# Patient Record
Sex: Male | Born: 1960 | Race: White | Hispanic: Yes | Marital: Married | Smoking: Never smoker
Health system: Southern US, Community
[De-identification: ages and names within clinical notes are randomized; demographics above are authoritative.]

## PROBLEM LIST (undated history)

## (undated) DIAGNOSIS — R079 Chest pain, unspecified: Secondary | ICD-10-CM

## (undated) DIAGNOSIS — T7840XA Allergy, unspecified, initial encounter: Secondary | ICD-10-CM

## (undated) DIAGNOSIS — Z8711 Personal history of peptic ulcer disease: Secondary | ICD-10-CM

## (undated) DIAGNOSIS — I10 Essential (primary) hypertension: Secondary | ICD-10-CM

## (undated) DIAGNOSIS — N200 Calculus of kidney: Secondary | ICD-10-CM

## (undated) DIAGNOSIS — E7800 Pure hypercholesterolemia, unspecified: Secondary | ICD-10-CM

## (undated) DIAGNOSIS — K219 Gastro-esophageal reflux disease without esophagitis: Secondary | ICD-10-CM

## (undated) DIAGNOSIS — R51 Headache: Secondary | ICD-10-CM

## (undated) DIAGNOSIS — Z8719 Personal history of other diseases of the digestive system: Secondary | ICD-10-CM

## (undated) DIAGNOSIS — R519 Headache, unspecified: Secondary | ICD-10-CM

## (undated) DIAGNOSIS — R7303 Prediabetes: Secondary | ICD-10-CM

## (undated) HISTORY — DX: Gastro-esophageal reflux disease without esophagitis: K21.9

## (undated) HISTORY — PX: BACK SURGERY: SHX140

## (undated) HISTORY — PX: UVULOPALATOPHARYNGOPLASTY: SHX827

## (undated) HISTORY — DX: Allergy, unspecified, initial encounter: T78.40XA

---

## 1990-11-20 HISTORY — PX: SHOULDER ARTHROSCOPY W/ ROTATOR CUFF REPAIR: SHX2400

## 2003-11-21 HISTORY — PX: NOSE SURGERY: SHX723

## 2003-11-21 HISTORY — PX: LUMBAR DISC SURGERY: SHX700

## 2006-12-23 ENCOUNTER — Emergency Department (HOSPITAL_COMMUNITY): Admission: EM | Admit: 2006-12-23 | Discharge: 2006-12-23 | Payer: Self-pay | Admitting: Emergency Medicine

## 2010-03-20 HISTORY — PX: CARDIAC CATHETERIZATION: SHX172

## 2011-09-08 ENCOUNTER — Emergency Department (HOSPITAL_COMMUNITY)
Admission: EM | Admit: 2011-09-08 | Discharge: 2011-09-08 | Disposition: A | Discharge status: Home / Self Care | Payer: Managed Care, Other (non HMO) | Attending: Emergency Medicine | Admitting: Emergency Medicine

## 2011-09-08 ENCOUNTER — Emergency Department (HOSPITAL_COMMUNITY): Payer: Managed Care, Other (non HMO)

## 2011-09-08 DIAGNOSIS — R5381 Other malaise: Secondary | ICD-10-CM | POA: Insufficient documentation

## 2011-09-08 DIAGNOSIS — R5383 Other fatigue: Secondary | ICD-10-CM | POA: Insufficient documentation

## 2011-09-08 DIAGNOSIS — I498 Other specified cardiac arrhythmias: Secondary | ICD-10-CM | POA: Insufficient documentation

## 2011-09-08 DIAGNOSIS — R110 Nausea: Secondary | ICD-10-CM | POA: Insufficient documentation

## 2011-09-08 LAB — COMPREHENSIVE METABOLIC PANEL
ALT: 28 U/L (ref 0–53)
AST: 21 U/L (ref 0–37)
BUN: 15 mg/dL (ref 6–23)
CO2: 25 mEq/L (ref 19–32)
GFR calc non Af Amer: 84 mL/min — ABNORMAL LOW (ref >90)
Potassium: 4.1 mEq/L (ref 3.5–5.1)

## 2011-09-08 LAB — GLUCOSE, CAPILLARY: Glucose-Capillary: 107 mg/dL — ABNORMAL HIGH (ref 70–99)

## 2011-09-08 LAB — DIFFERENTIAL
Basophils Absolute: 0 10*3/uL (ref 0.0–0.1)
Eosinophils Absolute: 0.2 10*3/uL (ref 0.0–0.7)
Eosinophils Relative: 2 % (ref 0–5)
Lymphocytes Relative: 34 % (ref 12–46)
Lymphs Abs: 2.3 10*3/uL (ref 0.7–4.0)
Monocytes Absolute: 0.8 10*3/uL (ref 0.1–1.0)

## 2011-09-08 LAB — CBC
HCT: 42.2 % (ref 39.0–52.0)
Hemoglobin: 14.7 g/dL (ref 13.0–17.0)
MCHC: 34.8 g/dL (ref 30.0–36.0)
Platelets: 217 10*3/uL (ref 150–400)
RBC: 4.64 MIL/uL (ref 4.22–5.81)
RDW: 13.3 % (ref 11.5–15.5)

## 2011-12-17 ENCOUNTER — Encounter (HOSPITAL_COMMUNITY): Payer: Self-pay | Admitting: *Deleted

## 2011-12-17 ENCOUNTER — Other Ambulatory Visit: Payer: Self-pay

## 2011-12-17 ENCOUNTER — Emergency Department (HOSPITAL_COMMUNITY): Payer: Self-pay

## 2011-12-17 ENCOUNTER — Inpatient Hospital Stay (HOSPITAL_COMMUNITY)
Admission: EM | Admit: 2011-12-17 | Discharge: 2011-12-18 | DRG: 313 | Disposition: A | Discharge status: Home / Self Care | Payer: 59 | Attending: Cardiovascular Disease | Admitting: Cardiovascular Disease

## 2011-12-17 DIAGNOSIS — M25512 Pain in left shoulder: Secondary | ICD-10-CM

## 2011-12-17 DIAGNOSIS — E782 Mixed hyperlipidemia: Secondary | ICD-10-CM | POA: Diagnosis present

## 2011-12-17 DIAGNOSIS — M25519 Pain in unspecified shoulder: Secondary | ICD-10-CM | POA: Diagnosis present

## 2011-12-17 DIAGNOSIS — I10 Essential (primary) hypertension: Secondary | ICD-10-CM

## 2011-12-17 DIAGNOSIS — I200 Unstable angina: Secondary | ICD-10-CM

## 2011-12-17 DIAGNOSIS — M542 Cervicalgia: Secondary | ICD-10-CM | POA: Diagnosis present

## 2011-12-17 DIAGNOSIS — I2510 Atherosclerotic heart disease of native coronary artery without angina pectoris: Secondary | ICD-10-CM | POA: Diagnosis present

## 2011-12-17 DIAGNOSIS — R079 Chest pain, unspecified: Principal | ICD-10-CM | POA: Diagnosis present

## 2011-12-17 DIAGNOSIS — M940 Chondrocostal junction syndrome [Tietze]: Secondary | ICD-10-CM | POA: Diagnosis present

## 2011-12-17 HISTORY — DX: Pure hypercholesterolemia, unspecified: E78.00

## 2011-12-17 HISTORY — DX: Prediabetes: R73.03

## 2011-12-17 HISTORY — DX: Essential (primary) hypertension: I10

## 2011-12-17 LAB — CBC
MCH: 31.7 pg (ref 26.0–34.0)
MCHC: 35.3 g/dL (ref 30.0–36.0)
MCV: 89.8 fL (ref 78.0–100.0)
RBC: 4.7 MIL/uL (ref 4.22–5.81)
RDW: 13.6 % (ref 11.5–15.5)
WBC: 4.2 10*3/uL (ref 4.0–10.5)

## 2011-12-17 LAB — BASIC METABOLIC PANEL
CO2: 23 mEq/L (ref 19–32)
Creatinine, Ser: 0.92 mg/dL (ref 0.50–1.35)
GFR calc non Af Amer: >90 mL/min (ref >90)
Glucose, Bld: 107 mg/dL — ABNORMAL HIGH (ref 70–99)

## 2011-12-17 LAB — HEPATIC FUNCTION PANEL
ALT: 32 U/L (ref 0–53)
AST: 25 U/L (ref 0–37)
Albumin: 4.3 g/dL (ref 3.5–5.2)
Alkaline Phosphatase: 61 U/L (ref 39–117)
Bilirubin, Direct: <0.1 mg/dL (ref 0.0–0.3)
Total Bilirubin: 0.3 mg/dL (ref 0.3–1.2)

## 2011-12-17 LAB — PROTIME-INR: INR: 0.92 (ref 0.00–1.49)

## 2011-12-17 LAB — DIFFERENTIAL
Basophils Relative: 1 % (ref 0–1)
Eosinophils Absolute: 0.2 10*3/uL (ref 0.0–0.7)
Neutro Abs: 2.1 10*3/uL (ref 1.7–7.7)
Neutrophils Relative %: 50 % (ref 43–77)

## 2011-12-17 LAB — HEMOGLOBIN A1C: Mean Plasma Glucose: 131 mg/dL — ABNORMAL HIGH (ref <117)

## 2011-12-17 MED ORDER — ONDANSETRON HCL 4 MG/2ML IJ SOLN
4.0000 mg | Freq: Once | INTRAMUSCULAR | Status: AC
  Administered 2011-12-17: 4 mg via INTRAVENOUS

## 2011-12-17 MED ORDER — PANTOPRAZOLE SODIUM 40 MG PO TBEC
40.0000 mg | DELAYED_RELEASE_TABLET | Freq: Every day | ORAL | Status: DC
  Filled 2011-12-17: qty 1

## 2011-12-17 MED ORDER — MORPHINE SULFATE 4 MG/ML IJ SOLN
4.0000 mg | Freq: Once | INTRAMUSCULAR | Status: DC
  Filled 2011-12-17: qty 1

## 2011-12-17 MED ORDER — SODIUM CHLORIDE 0.9 % IV SOLN
Freq: Once | INTRAVENOUS | Status: AC
  Administered 2011-12-17: 20 mL/h via INTRAVENOUS

## 2011-12-17 MED ORDER — ASPIRIN 81 MG PO CHEW: 324.0000 mg | CHEWABLE_TABLET | Freq: Once | ORAL | Status: DC

## 2011-12-17 MED ORDER — MORPHINE SULFATE 4 MG/ML IJ SOLN
4.0000 mg | Freq: Once | INTRAMUSCULAR | Status: AC
  Administered 2011-12-17: 4 mg via INTRAVENOUS
  Filled 2011-12-17: qty 1

## 2011-12-17 MED ORDER — ASPIRIN 81 MG PO CHEW
324.0000 mg | CHEWABLE_TABLET | Freq: Once | ORAL | Status: AC
  Administered 2011-12-17: 324 mg via ORAL
  Filled 2011-12-17: qty 1

## 2011-12-17 MED ORDER — HEPARIN BOLUS VIA INFUSION
2200.0000 [IU] | Freq: Once | INTRAVENOUS | Status: AC
  Administered 2011-12-17: 2200 [IU] via INTRAVENOUS

## 2011-12-17 MED ORDER — SIMVASTATIN 20 MG PO TABS
20.0000 mg | ORAL_TABLET | Freq: Every day | ORAL | Status: DC
  Administered 2011-12-17: 20 mg via ORAL
  Filled 2011-12-17 (×3): qty 1

## 2011-12-17 MED ORDER — OXYCODONE-ACETAMINOPHEN 5-325 MG PO TABS
1.0000 | ORAL_TABLET | ORAL | Status: DC | PRN
  Administered 2011-12-18: 1 via ORAL
  Filled 2011-12-17: qty 1

## 2011-12-17 MED ORDER — ONDANSETRON HCL 4 MG/2ML IJ SOLN
INTRAMUSCULAR | Status: AC
  Administered 2011-12-17: 4 mg via INTRAVENOUS
  Filled 2011-12-17: qty 2

## 2011-12-17 MED ORDER — ACETAMINOPHEN 325 MG PO TABS: 650.0000 mg | ORAL_TABLET | Freq: Four times a day (QID) | ORAL | Status: DC | PRN

## 2011-12-17 MED ORDER — OLMESARTAN MEDOXOMIL 20 MG PO TABS
20.0000 mg | ORAL_TABLET | Freq: Every day | ORAL | Status: DC
  Administered 2011-12-17: 20 mg via ORAL
  Filled 2011-12-17 (×3): qty 1

## 2011-12-17 MED ORDER — KETOROLAC TROMETHAMINE 10 MG PO TABS
10.0000 mg | ORAL_TABLET | Freq: Four times a day (QID) | ORAL | Status: DC | PRN
  Filled 2011-12-17: qty 1

## 2011-12-17 MED ORDER — KETOROLAC TROMETHAMINE 30 MG/ML IJ SOLN
INTRAMUSCULAR | Status: AC
  Filled 2011-12-17: qty 1

## 2011-12-17 MED ORDER — HEPARIN SOD (PORCINE) IN D5W 100 UNIT/ML IV SOLN
1200.0000 [IU]/h | INTRAVENOUS | Status: DC
  Administered 2011-12-17: 1200 [IU]/h via INTRAVENOUS
  Filled 2011-12-17: qty 250

## 2011-12-17 MED ORDER — ONDANSETRON HCL 4 MG/2ML IJ SOLN
4.0000 mg | Freq: Once | INTRAMUSCULAR | Status: DC
  Filled 2011-12-17: qty 2

## 2011-12-17 MED ORDER — NITROGLYCERIN IN D5W 200-5 MCG/ML-% IV SOLN
2.0000 ug/min | Freq: Once | INTRAVENOUS | Status: AC
  Administered 2011-12-17: 5 ug/min via INTRAVENOUS
  Filled 2011-12-17: qty 250

## 2011-12-17 MED ORDER — KETOROLAC TROMETHAMINE 30 MG/ML IJ SOLN
30.0000 mg | INTRAMUSCULAR | Status: AC
  Administered 2011-12-17: 16:00:00 via INTRAVENOUS

## 2011-12-17 NOTE — ED Provider Notes (Signed)
History     CSN: [id number]  Arrival date & time 12/17/11  1130   First MD Initiated Contact with Patient 12/17/11 1157      Chief Complaint  Patient presents with  . Chest Pain    (Consider location/radiation/quality/duration/timing/severity/associated sxs/prior treatment) HPI  Patient presents to the emergency department complaining of intermittent left-sided chest pain over the last month with approximately 3-4 episodes of chest pain that he describes as acute in onset of chest pain with radiation of pain into left neck and down left arm that he describes as crushing with associated nausea, diaphoresis, and shortness of breath. Patient states the symptoms will last anywhere between 20-30 minutes but then resolve after he sits down and takes an aspirin. Patient states that the last episode was this morning at approximately 6 AM and then resolved after taking ASA and after about 20 minutes. Patient denies any current symptoms. Patient has history of high blood pressure, high cholesterol, and borderline diabetes. Patient has recently moved from [place] and has not established care with a primary care physician in [place] up to this point. Patient has significant family history early heart disease with his mother dying at age 19 of a heart attack, his father having heart disease at age 3, and a sister having "heart problems at 51." Patient states that in May of 2011 he had a cardiac catheterization in a hospital in [place] and was told "no blockages but grease in my arteries." Patient states he did not have to follow up with cardiologist after the catheterization. Patient takes a daily aspirin.  Past Medical History  Diagnosis Date  . Hypertension   . High cholesterol   . Borderline diabetes     Past Surgical History  Procedure Date  . Cardiac catheterization     May 2011  . Back surgery   . Hernia repair   . Left shoulder surgery     History reviewed. No pertinent family  history.  History  Substance Use Topics  . Smoking status: Never Smoker   . Smokeless tobacco: Never Used  . Alcohol Use: No      Review of Systems  All other systems reviewed and are negative.    Allergies  Contrast media; Food; and Shrimp  Home Medications   Current Outpatient Rx  Name Route Sig Dispense Refill  . ASPIRIN EC 81 MG PO TBEC Oral Take 81 mg by mouth daily.    . ATORVASTATIN CALCIUM 10 MG PO TABS Oral Take 10 mg by mouth daily.    Marland Kitchen VITAMIN D 2000 UNITS PO TABS Oral Take 2,000 Units by mouth daily.    Marland Kitchen NAPROXEN 250 MG PO TABS Oral Take 250 mg by mouth 2 (two) times daily as needed. For pain.    Marland Kitchen OLMESARTAN MEDOXOMIL 20 MG PO TABS Oral Take 20 mg by mouth daily.    Marland Kitchen OMEPRAZOLE 40 MG PO CPDR Oral Take 40 mg by mouth daily.      BP 136/74  Pulse 65  Temp(Src) 98.4 F (36.9 C) (Oral)  Resp 18  Ht 5\' 5"  (1.651 m)  Wt 180 lb (81.647 kg)  BMI 29.95 kg/m2  SpO2 100%  Physical Exam  Nursing note and vitals reviewed. Constitutional: He is oriented to person, place, and time. He appears well-developed and well-nourished. No distress.  HENT:  Head: Normocephalic and atraumatic.  Eyes: Conjunctivae are normal.  Neck: Normal range of motion. Neck supple.  Cardiovascular: Normal rate, regular rhythm, normal heart  sounds and intact distal pulses.  Exam reveals no gallop and no friction rub.   No murmur heard. Pulmonary/Chest: Effort normal and breath sounds normal. No respiratory distress. He has no wheezes. He has no rales. He exhibits no tenderness.  Abdominal: Soft. Bowel sounds are normal. He exhibits no distension and no mass. There is no tenderness. There is no rebound and no guarding.  Musculoskeletal: Normal range of motion. He exhibits no edema and no tenderness.  Neurological: He is alert and oriented to person, place, and time.  Skin: Skin is warm and dry. No rash noted. He is not diaphoretic. No erythema.  Psychiatric: He has a normal mood and  affect.    ED Course  Procedures (including critical care time)  PO aspirin, nitroglycerin drip. IV morphine and zofran   Date: 12/17/2011  Rate: 72  Rhythm: normal sinus rhythm  QRS Axis: normal  Intervals: normal  ST/T Wave abnormalities: normal  Conduction Disutrbances:none  Narrative Interpretation:   Old EKG Reviewed: non provactive EKG with no significant changes from Sep 08, 2011    Labs Reviewed  DIFFERENTIAL - Abnormal; Notable for the following:    Eosinophils Relative 6 (*)    All other components within normal limits  BASIC METABOLIC PANEL - Abnormal; Notable for the following:    Sodium 134 (*)    Glucose, Bld 107 (*)    All other components within normal limits  CBC  TROPONIN I  PROTIME-INR  HEPATIC FUNCTION PANEL  HEMOGLOBIN A1C   Dg Chest 2 View  12/17/2011  *RADIOLOGY REPORT*  Clinical Data: Chest pain for 1 month.  SOB for 2 days.  History of hypertension.  CHEST - 2 VIEW  Comparison: 09/08/2011  Findings: The heart size and mediastinal contours are within normal limits.  Both lungs are clear.  The visualized skeletal structures are unremarkable.  IMPRESSION: Negative exam.  Original Report Authenticated By: Rosealee Albee, M.D.     1. Unstable angina   2. Chest pain       MDM  VSS. Dr. Roseanne Reno to admit for work up.       [place], [place] 12/17/11 1950

## 2011-12-17 NOTE — ED Notes (Signed)
Pt from home c/o left chest pain radiating down left arm into back and up into neck. Pt also endorses sweating, pale, dizziness, nausea, headache, shortness of breath and generalized weakness that started last night.

## 2011-12-17 NOTE — H&P (Addendum)
Physician History and Physical  Patient ID: Alejandro Turner MRN: [id number] DOB/AGE: 52-May-1962 52 y.o. Admit date: 12/17/2011  Primary Care Physician: No primary provider on file. Primary Cardiologist: New  Active Problems:  Chest pain  HTN (hypertension)  Mixed hyperlipidemia  Left shoulder pain   HPI:  51 yo family history of CAD, HTN, and elevated lipids.  To [facility] ER with [facility].  Pain somewhat atypical.  More left shoulder and neck.  Associated with numbness when sleeping.  Previous traumatic injury and  Left shoulder surgery.  Pain with neck and arm motion as well.  Also complains of chronic dizzyness.  Had cath 2 years ago in [place] with "grease" but no bad blockages and no need for PCI/Stent.  Had f/U stress test Last year also normal.  Pain is similar.  No associated dyspnea, palpitations, syncope or edema.  Pain 3/10 on iv nitro despite normal enzymes and normal ECG.  Compliant with meds.  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    Review of systems complete and found to be negative unless listed above   Past Medical History  Diagnosis Date  . Hypertension   . High cholesterol   . Borderline diabetes     History reviewed. No pertinent family history.  History   Social History  . Marital Status: Married    Spouse Name: N/A    Number of Children: N/A  . Years of Education: N/A   Occupational History  . 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 Smoking status: Never Smoker   . Smokeless tobacco: Never Used  . Alcohol Use: No  . Drug Use: No  . Sexually Active:    Other Topics Concern  . Not on file   Social History Narrative   Non smokerNon drinkerSedentaryMoved from [place] 52 months ago    Past Surgical History  Procedure Date  . Cardiac catheterization     May 2011  . Back surgery   . Hernia repair   . Left shoulder surgery       Physical Exam: Blood pressure 115/77, pulse 75,  temperature 98.4 F (36.9 C), temperature source Oral, resp. rate 19, height 5\' 5"  (1.651 m), weight 81.647 kg (180 lb), SpO2 100.00%.  Affect appropriate Healthy:  appears stated age HEENT: normal Neck supple with no adenopathy JVP normal no bruits no thyromegaly Lungs clear with no wheezing and good diaphragmatic motion Heart:  S1/S2 no murmur, no rub, gallop or click PMI normal Abdomen: benighn, BS positve, no tenderness, no AAA no bruit.  No HSM or HJR Distal pulses intact with no bruits No edema Neuro non-focal Skin warm and dry No muscular weakness Scar left shoulder from surgery   Labs:   Lab Results  Component Value Date   WBC 4.2 12/17/2011   HGB 14.9 12/17/2011   HCT 42.2 12/17/2011   MCV 89.8 12/17/2011   PLT 232 12/17/2011     Lab 12/17/11 1230  NA 134*  K 4.1  CL 100  CO2 23  BUN 16  CREATININE 0.92  CALCIUM 10.0  PROT 7.8  BILITOT 0.3  ALKPHOS 61  ALT 32  AST 25  GLUCOSE 107*   Allergies  Allergen Reactions  . Contrast Media (Iodinated Diagnostic Agents) Rash  . Food Rash    Soy beans  . Shrimp (Shellfish Allergy) Hives      Radiology: Dg  Chest 2 View  12/17/2011  *RADIOLOGY REPORT*  Clinical Data: Chest pain for 1 month.  SOB for 2 days.  History of hypertension.  CHEST - 2 VIEW  Comparison: 09/08/2011  Findings: The heart size and mediastinal contours are within normal limits.  Both lungs are clear.  The visualized skeletal structures are unremarkable.  IMPRESSION: Negative exam.  Original Report Authenticated By: Rosealee Albee, M.D.    EKG: NSR normal ECG  ASSESSMENT AND PLAN:  Chest Pain:  Atypical featues  Multiple CRF;s including HTN, chol and family history.  Historically negative w.u last two years  Admit  R/O He is allergic to contrast.  Wean nitro.  Toradol for pain.  Stress echo in am HTN:  Well controlled continue home meds Chol:  Continue statin LFT;s normal OSA:  Previous nasal an pallate surgery with chronic dizzyness     Needs primary care MD  Cardiologist in [facility] [place] [phone]  Signed: Theron Arista Nishan52/27/2013, 4:06 PM

## 2011-12-17 NOTE — ED Provider Notes (Signed)
Medical screening examination/treatment/procedure(s) were conducted as a shared visit with non-physician practitioner(s) and myself.  I personally evaluated the patient during the encounter  Concerning story for unstable angina with multiple cardiac risk factors. ASA, heparin, nitro. ecg and troponin normal. Spoke with Dr Eden Emms, cardiology who will evaluate in ER and admit  1. Unstable angina   2. Chest pain    Results for orders placed during the hospital encounter of 12/17/11  CBC      Component Value Range   WBC 4.2  4.0 - 10.5 (K/uL)   RBC 4.70  4.22 - 5.81 (MIL/uL)   Hemoglobin 14.9  13.0 - 17.0 (g/dL)   HCT 40.9  81.1 - 91.4 (%)   MCV 89.8  78.0 - 100.0 (fL)   MCH 31.7  26.0 - 34.0 (pg)   MCHC 35.3  30.0 - 36.0 (g/dL)   RDW 78.2  95.6 - 21.3 (%)   Platelets 232  150 - 400 (K/uL)  DIFFERENTIAL      Component Value Range   Neutrophils Relative 50  43 - 77 (%)   Neutro Abs 2.1  1.7 - 7.7 (K/uL)   Lymphocytes Relative 33  12 - 46 (%)   Lymphs Abs 1.4  0.7 - 4.0 (K/uL)   Monocytes Relative 10  3 - 12 (%)   Monocytes Absolute 0.4  0.1 - 1.0 (K/uL)   Eosinophils Relative 6 (*) 0 - 5 (%)   Eosinophils Absolute 0.2  0.0 - 0.7 (K/uL)   Basophils Relative 1  0 - 1 (%)   Basophils Absolute 0.0  0.0 - 0.1 (K/uL)  BASIC METABOLIC PANEL      Component Value Range   Sodium 134 (*) 135 - 145 (mEq/L)   Potassium 4.1  3.5 - 5.1 (mEq/L)   Chloride 100  96 - 112 (mEq/L)   CO2 23  19 - 32 (mEq/L)   Glucose, Bld 107 (*) 70 - 99 (mg/dL)   BUN 16  6 - 23 (mg/dL)   Creatinine, Ser 0.86  0.50 - 1.35 (mg/dL)   Calcium 57.8  8.4 - 10.5 (mg/dL)   GFR calc non Af Amer >90  >90 (mL/min)   GFR calc Af Amer >90  >90 (mL/min)  TROPONIN I      Component Value Range   Troponin I <0.30  <0.30 (ng/mL)  PROTIME-INR      Component Value Range   Prothrombin Time 12.6  11.6 - 15.2 (seconds)   INR 0.92  0.00 - 1.49   HEPATIC FUNCTION PANEL      Component Value Range   Total Protein 7.8  6.0 - 8.3  (g/dL)   Albumin 4.3  3.5 - 5.2 (g/dL)   AST 25  0 - 37 (U/L)   ALT 32  0 - 53 (U/L)   Alkaline Phosphatase 61  39 - 117 (U/L)   Total Bilirubin 0.3  0.3 - 1.2 (mg/dL)   Bilirubin, Direct <4.6  0.0 - 0.3 (mg/dL)   Indirect Bilirubin NOT CALCULATED  0.3 - 0.9 (mg/dL)  HEMOGLOBIN N6E      Component Value Range   Hemoglobin A1C 6.2 (*) <5.7 (%)   Mean Plasma Glucose 131 (*) <117 (mg/dL)     Lyanne Co, MD 12/17/11 2045

## 2011-12-17 NOTE — Progress Notes (Signed)
ANTICOAGULATION CONSULT NOTE - Initial Consult  Pharmacy Consult for Heparin Indication: ACS/STEMI  Allergies  Allergen Reactions  . Contrast Media (Iodinated Diagnostic Agents) Rash  . Food Rash    Soy beans  . Shrimp (Shellfish Allergy) Hives    Patient Measurements: Height: 5\' 5"  (165.1 cm) Weight: 180 lb (81.647 kg) IBW/kg (Calculated) : 61.5  Heparin Dosing Weight: 69kg  Vital Signs: Temp: 98.4 F (36.9 C) (01/27 1143) Temp src: Oral (01/27 1143) BP: 118/73 mmHg (01/27 1333) Pulse Rate: 68  (01/27 1333)  Labs:  Basename 12/17/11 1230  HGB 14.9  HCT 42.2  PLT 232  APTT --  LABPROT 12.6  INR 0.92  HEPARINUNFRC --  CREATININE 0.92  CKTOTAL --  CKMB --  TROPONINI <0.30   Estimated Creatinine Clearance: 93.4 ml/min (by C-G formula based on Cr of 0.92).  Medical History: Past Medical History  Diagnosis Date  . Hypertension   . High cholesterol   . Borderline diabetes     Medications:  Scheduled:    . sodium chloride   Intravenous Once  . aspirin  324 mg Oral Once  . morphine  4 mg Intravenous Once  . nitroGLYCERIN  2-200 mcg/min Intravenous Once  . ondansetron        Assessment: 51 yo male presents with chest pain radiating down left arm into back and neck; troponin is negative.  Beginning IV heparin for full dose anticoagulation.  Goal of Therapy:  Heparin level 0.3-0.7 units/ml   Plan:   Heparin 2200 units IV bolus x1, then  Heparin 1200 units/hr IV infusion  Check heparin level in 6hrs  Check daily heparin level and CBC  Loralee Pacas, PharmD, BCPS Pager: [phone] 12/17/2011,1:39 PM

## 2011-12-18 ENCOUNTER — Other Ambulatory Visit: Payer: Self-pay

## 2011-12-18 DIAGNOSIS — R079 Chest pain, unspecified: Secondary | ICD-10-CM

## 2011-12-18 DIAGNOSIS — R072 Precordial pain: Secondary | ICD-10-CM

## 2011-12-18 LAB — CBC
HCT: 40.9 % (ref 39.0–52.0)
Hemoglobin: 13.7 g/dL (ref 13.0–17.0)
MCH: 30.8 pg (ref 26.0–34.0)
MCHC: 33.5 g/dL (ref 30.0–36.0)
MCV: 91.9 fL (ref 78.0–100.0)
RDW: 13.9 % (ref 11.5–15.5)

## 2011-12-18 MED ORDER — KETOROLAC TROMETHAMINE 10 MG PO TABS
10.0000 mg | ORAL_TABLET | Freq: Four times a day (QID) | ORAL | Status: DC
  Filled 2011-12-18 (×4): qty 1

## 2011-12-18 MED ORDER — OXYCODONE-ACETAMINOPHEN 5-325 MG PO TABS: 1.0000 | ORAL_TABLET | ORAL | Status: AC | PRN

## 2011-12-18 MED ORDER — NITROGLYCERIN 0.4 MG SL SUBL
SUBLINGUAL_TABLET | SUBLINGUAL | Status: AC
  Administered 2011-12-18: 09:00:00
  Filled 2011-12-18: qty 25

## 2011-12-18 MED ORDER — IBUPROFEN 800 MG PO TABS: 800.0000 mg | ORAL_TABLET | Freq: Three times a day (TID) | ORAL | Status: AC

## 2011-12-18 MED ORDER — MORPHINE SULFATE 2 MG/ML IJ SOLN: 2.0000 mg | INTRAMUSCULAR | Status: DC | PRN

## 2011-12-18 MED ORDER — NAPROXEN 250 MG PO TABS: 250.0000 mg | ORAL_TABLET | Freq: Two times a day (BID) | ORAL | Status: DC | PRN

## 2011-12-18 NOTE — Progress Notes (Signed)
GXT Echo 12/18/2011 10:47 AM  Pre test: No CP/SOB. With GXT, pt had SOB with exertion but no CP. Target HR 143 reached and exceeded in stage 3. Used manual increases to get HR > 160. Pt tolerated well. ECG with upsloping ST depression but no down-sloping segments and no ST elevation. BP appropriately increased with exercise.   Images and final report pending.

## 2011-12-18 NOTE — Progress Notes (Signed)
Received call from ECHO lab stating that patient needs help finding PCP. Noted in system that patient has [employer] and that he needs to call the number on the back of his card and the insurance company will help him find a PCP that is in his network in order to receive the most benefits. This CM does not have access to [employer] in network providers.  Genella Rife [facility] 12/18/2011 1137am  Just received call back stating that patient states that he really does not have any insurance. Will provide him with numbers for [facility], [facility] for self pay clinics. Advised that he will have to meet eligibility first and then they will set up appointment and could be some time out. Genella Rife [facility] 12/18/2011 1140am

## 2011-12-18 NOTE — Progress Notes (Signed)
2 saline locks discontinued.  One from right hand , the other left hand.  No difficulty, both angiocaths intact.      Haskel Khan RN

## 2011-12-18 NOTE — Progress Notes (Signed)
RN called because pt c/o 6/10 cp earlier, now worse. [facility] ready to bring over for MV. Pt rec'd SL NTG x1 without sig change in pain. Reviewed data. Pt with CP continuous > 12 hours with negative enzymes and no ST elevation. Per [facility] note, pain worse with movement and Sx consistent with MS pain. Pt has refused Toradol but accepts MSO4 and Percocet for pain.   Advised RN, OK to bring over for MV and re-ordered MSO4 PRN x 3 doses. Will encourage pt to accept Toradol and schedule it q  6 hours x 24 hours.

## 2011-12-18 NOTE — Discharge Summary (Signed)
CARDIOLOGY DISCHARGE SUMMARY   Patient ID: Ramondo Dietze MRN: [id number] DOB/AGE: 1961-01-30 51 y.o.  Admit date: 12/17/2011 Discharge date: 12/18/2011  Primary Discharge Diagnosis:  Chest pain Secondary Discharge Diagnosis:  Patient Active Problem List  Diagnoses  . Chest pain  . HTN (hypertension)  . Mixed hyperlipidemia  . Left shoulder pain   Procedures: 2-view CXR, GXT Echo  Hospital Course: Mr Prestia is a 51 year-old male with a history of chest pain and ?minimal/non-obstructive disease by cath in [place] a few years ago. He had prolonged chest pain and developed SOB as well. He came to the hospital where he was admitted for further evaluation and treatment.  His cardiac enzymes were negative for MI. He had a slight elevation in the CK but no elevation in the MB or Troponins. His HgA1C was mildly elevated and he is encouraged to limit sugars and carbohydrates in his diet. He was continued on his home medications for cholesterol and hypertension. His chest pain did not change with nitroglycerin but responded to pain control meds.   On 12-18-2011, Mr Rennaker had a stress-echocardiogram. He had good exercise tolerance and target heart rate (143) was reached in stage 3. He continued with exercise and was able to get his heart rate up to >160. No chest pain with exertion. He was appropriately SOB with exertion and his BP increased with exercise as well. The images were reviewed by Dr Myrtis Ser. His EF was normal and there were no wall motion abnormalities with exertion. Mr Steyer is considered stable for discharge, to follow up with cardiology as needed. He is encouraged to obtain a primary care physician.  Labs:   Lab Results  Component Value Date   WBC 5.9 12/18/2011   HGB 13.7 12/18/2011   HCT 40.9 12/18/2011   MCV 91.9 12/18/2011   PLT 224 12/18/2011    Lab 12/17/11 1230  NA 134*  K 4.1  CL 100  CO2 23  BUN 16  CREATININE 0.92  CALCIUM 10.0  PROT 7.8  BILITOT 0.3  ALKPHOS 61   ALT 32  AST 25  GLUCOSE 107*    Lab Results  Component Value Date   HGBA1C 6.2* 12/17/2011     Basename 12/17/11 2010 12/17/11 1230  CKTOTAL 258* --  CKMB 2.6 --  CKMBINDEX -- --  TROPONINI <0.30 <0.30   Lipid Panel  No results found for this basename: chol, trig, hdl, cholhdl, vldl, ldlcalc    No results found for this basename: probnp    Basename 12/17/11 1230  INR 0.92       Radiology:  CHEST - 2 VIEW 12/17/2011 Comparison: 09/08/2011 Findings: The heart size and mediastinal contours are within normal limits. Both lungs are clear. The visualized skeletal structures are unremarkable. IMPRESSION: Negative exam.  EKG:18-Dec-2011 09:07:06 Normal sinus rhythm Normal ECG Vent. rate 69 BPM PR interval 146 ms QRS duration 104 ms QT/QTc 370/396 ms P-R-T axes 57 47 30  Echo: 12/18/2011 Study Conclusions - Stress ECG conclusions: The stress ECG was normal. - Impressions: At rest there is normal LV function with no wall abnormalities. The EF is 60%. With stress there is increase in thickening and contractility in all segments. Wall motion is vigorous. Cavity size decreases. This is a normal stress echo study. Impressions: - At rest there is normal LV function with no wall abnormalities. The EF is 60%. With stress there is increase in thickening and contractility in all segments. Wall motion is vigorous. Cavity size decreases. This is  a normal stress echo study   FOLLOW UP PLANS AND APPOINTMENTS Discharge Orders    Future Appointments: Provider: Department: Dept Phone: Center:   12/29/2011 10:00 AM Wanda Plump, MD [facility] [phone] [facility]     Medication List  As of 12/18/2011 11:11 AM   TAKE these medications         aspirin EC 81 MG tablet   Take 81 mg by mouth daily.      atorvastatin 10 MG tablet   Commonly known as: LIPITOR   Take 10 mg by mouth daily.         olmesartan 20 MG tablet   Commonly known as: BENICAR   Take 20 mg by mouth  daily.      omeprazole 40 MG capsule   Commonly known as: PRILOSEC   Take 40 mg by mouth daily.      Vitamin D 2000 UNITS tablet   Take 2,000 Units by mouth daily.        STOP TAKING   naproxen 250 MG tablet while on Ibuprofen/Motrin   Commonly known as: NAPROSYN   Take 250 mg by mouth 2 (two) times daily as needed. For pain.    NEW MEDS: Ibuprofen 800 mg - 1 tab 3 x daily for 1 week, then OK to resume Naprosyn  Percocet 5/325 - 1 tab TID PRN (#20, no refills)    Follow-up Information    Follow up with Charlton Haws, MD in 3 months. (As needed)    Contact information:   [address], [address] [place] [address] [phone]          BRING ALL MEDICATIONS WITH YOU TO FOLLOW UP APPOINTMENTS  Time spent with patient to include physician time: 33 min Signed: Theodore Demark 12/18/2011, 11:11 AM Co-Sign MD

## 2011-12-18 NOTE — Progress Notes (Signed)
Stress Echo completed.  Victorya Hillman Johnson, RDCS 

## 2011-12-18 NOTE — Progress Notes (Signed)
Subjective:  No dyspnea. Complains of pain left upper chest and shoulder, constant. Pain is worse with arm movement and positional changes.  Objective:  Vital Signs in the last 24 hours: Temp:  [98 F (36.7 C)-98.4 F (36.9 C)] 98.2 F (36.8 C) (01/28 0557) Pulse Rate:  [64-75] 68  (01/28 0557) Resp:  [11-20] 17  (01/28 0557) BP: (104-147)/(62-83) 111/72 mmHg (01/28 0557) SpO2:  [97 %-100 %] 98 % (01/28 0557) Weight:  [81.647 kg (180 lb)] 81.647 kg (180 lb) (01/27 1917)  Intake/Output from previous day:    Physical Exam: Pt is alert and oriented, NAD HEENT: normal Neck: JVP - normal, carotids 2+= without bruits Lungs: CTA bilaterally Chest: pain in left upper chest with palpation CV: RRR without murmur or gallop Abd: soft, NT, Positive BS, no hepatomegaly Ext: no C/C/E, distal pulses intact and equal Skin: warm/dry no rash   Lab Results:  Basename 12/18/11 0425 12/17/11 1230  WBC 5.9 4.2  HGB 13.7 14.9  PLT 224 232    Basename 12/17/11 1230  NA 134*  K 4.1  CL 100  CO2 23  GLUCOSE 107*  BUN 16  CREATININE 0.92    Basename 12/17/11 2010 12/17/11 1230  TROPONINI <0.30 <0.30   Tele: sinus rhythm, no arrhythmia noted.  Assessment/Plan:  1. Atypical chest pain - reproducible on exam 2. HTN 3. Hyperlipidemia  Pt with CVRF, but pain atypical and I suspect costochondritis. Cardiac markers are negative. Plan for stress echo today, discharge home on 1-2 week course of NSAID if negative. Pt needs PCP - he lives in [place]. Further disposition pending stress test result.  Tonny Bollman, M.D. 12/18/2011, 6:49 AM

## 2011-12-19 ENCOUNTER — Encounter: Payer: Self-pay | Admitting: Internal Medicine

## 2011-12-19 ENCOUNTER — Ambulatory Visit (INDEPENDENT_AMBULATORY_CARE_PROVIDER_SITE_OTHER): Payer: Self-pay | Admitting: Internal Medicine

## 2011-12-19 VITALS — BP 122/76 | HR 65 | Temp 98.1°F | Ht 65.0 in | Wt 187.0 lb

## 2011-12-19 DIAGNOSIS — R7303 Prediabetes: Secondary | ICD-10-CM

## 2011-12-19 DIAGNOSIS — R5381 Other malaise: Secondary | ICD-10-CM

## 2011-12-19 DIAGNOSIS — R5383 Other fatigue: Secondary | ICD-10-CM

## 2011-12-19 DIAGNOSIS — R079 Chest pain, unspecified: Secondary | ICD-10-CM

## 2011-12-19 DIAGNOSIS — I10 Essential (primary) hypertension: Secondary | ICD-10-CM

## 2011-12-19 DIAGNOSIS — E782 Mixed hyperlipidemia: Secondary | ICD-10-CM

## 2011-12-19 DIAGNOSIS — R7309 Other abnormal glucose: Secondary | ICD-10-CM

## 2011-12-19 NOTE — Assessment & Plan Note (Signed)
Last A1c 6.2, continue with diet

## 2011-12-19 NOTE — Patient Instructions (Signed)
Will arrange for a endocrinology referral Will arrange for a holter monitor

## 2011-12-19 NOTE — Assessment & Plan Note (Signed)
Continue with core 

## 2011-12-19 NOTE — Progress Notes (Signed)
  Subjective:    Patient ID: Alejandro Turner, male    DOB: 12/09/1960, 51 y.o.   MRN: [id number]  HPI New patient, here w/ his wife (who is also my patient) He reports the following symptoms: Chest pain for at least 5 years, on and off, a different places in the chest. He presented to the hospital a few days ago, chart reviewed: Was eval by  cardiology, stress echo reportedly negative BMP and LFTs normal.A1c 6.2. CBC normal. Chest x-ray negative  Also 2 years history of intense headaches on -off, previous PCP did "CTs and MRIs" no clear-cut diagnosis was found. Also 6 months history of on and off episodes  nausea generalized weakness particularly in the legs, loss of equilibrium. Wife has witnessed these episodes and he becomes pale,  one time he responded well to glucose by mouth. His previous doctors have seen him with these symptoms several times, apparently saw a ?ENT and had a "balance test" which was normal.  Past medical history Hypertension Borderline diabetes Hyperlipidemia Cardiac catheterization on 03/2010, [place]---> normal GERD   Past surgical history Uvulectomy for snoring Back surgery 1999 Left shoulder surgery 2000 Nose surgery 2005   Social history Re-married, 4 children. Moved from [place] ~04-2011, wife lives in [place] From [place] Job--former school Runner, broadcasting/film/video, unemployed, in litigation with the school system in [place] Tobacco --no  Alcohol-- no    Family history Diabetes-- F, M, sister CAD-- F, M (MI age 1) , sister  Stroke--no Colon cancer--no Breast cancer-- GM, aunt, M, sister Prostate cancer--no   Review of Systems Denies any syncope per se. No seizures. GERD symptoms well controlled. Occasional palpitations with episodes as described above. Denies anxiety or depression per se. He has a history of severe snoring, currently is snoring only mildly, he wakes up rested.    Objective:   Physical Exam  Constitutional: He is oriented to person, place, and time. He  appears well-developed and well-nourished.  HENT:  Head: Normocephalic and atraumatic.  Cardiovascular: Normal rate, regular rhythm and normal heart sounds.   No murmur heard. Pulmonary/Chest: Effort normal and breath sounds normal. No respiratory distress. He has no wheezes. He has no rales.  Abdominal: Soft. He exhibits no distension. There is no tenderness. There is no rebound.  Musculoskeletal:       Speech, gait and motor are intact. CN normal  Neurological: He is alert and oriented to person, place, and time.  Psychiatric: He has a normal mood and affect. His behavior is normal. Judgment and thought content normal.       Assessment & Plan:  Today , I spent more than 35  min with the patient, >50% of the time counseling,learning  his complicated PMH, reviewing the chart and labs ordered by other providers .

## 2011-12-19 NOTE — Assessment & Plan Note (Signed)
Chronic symptoms Negative cardiac catheterization 2011 in [place]. Patient reports almost"one stress test every year for the last 5 years" Stress test yesterday reportedly normal.  Plan no further workup

## 2011-12-19 NOTE — Assessment & Plan Note (Signed)
Continue with Benicar 

## 2011-12-20 ENCOUNTER — Encounter: Payer: Self-pay | Admitting: Internal Medicine

## 2011-12-20 DIAGNOSIS — R5383 Other fatigue: Secondary | ICD-10-CM | POA: Insufficient documentation

## 2011-12-20 NOTE — Discharge Summary (Signed)
Agree as outlined. See my progress note the same date 

## 2011-12-20 NOTE — Assessment & Plan Note (Signed)
Episodes of episodic fatigue-weakness, HA, nausea, imbalance: Sx going on for 6 months, clinically not consistent with syncope or a seizure.  Differential diagnosis is large, plan: 1. holter 2. Refer to endocrinology : pheochromocytoma? , hypoglycemia? Will also consider referral to ENT, vertigo ?

## 2011-12-21 ENCOUNTER — Ambulatory Visit: Payer: Managed Care, Other (non HMO) | Admitting: Internal Medicine

## 2011-12-29 ENCOUNTER — Telehealth: Payer: Self-pay

## 2011-12-29 ENCOUNTER — Ambulatory Visit: Payer: Managed Care, Other (non HMO) | Admitting: Internal Medicine

## 2012-01-02 ENCOUNTER — Ambulatory Visit (INDEPENDENT_AMBULATORY_CARE_PROVIDER_SITE_OTHER): Payer: Self-pay | Admitting: Endocrinology

## 2012-01-02 ENCOUNTER — Other Ambulatory Visit (INDEPENDENT_AMBULATORY_CARE_PROVIDER_SITE_OTHER): Payer: Self-pay

## 2012-01-02 ENCOUNTER — Other Ambulatory Visit: Payer: Self-pay

## 2012-01-02 ENCOUNTER — Encounter: Payer: Self-pay | Admitting: Endocrinology

## 2012-01-02 VITALS — BP 104/62 | HR 66 | Temp 97.1°F | Ht 65.0 in | Wt 184.0 lb

## 2012-01-02 DIAGNOSIS — E871 Hypo-osmolality and hyponatremia: Secondary | ICD-10-CM

## 2012-01-02 DIAGNOSIS — I10 Essential (primary) hypertension: Secondary | ICD-10-CM

## 2012-01-02 MED ORDER — COSYNTROPIN 0.25 MG IJ SOLR
0.2500 mg | Freq: Once | INTRAMUSCULAR | Status: AC
  Administered 2012-01-02: 0.25 mg via INTRAMUSCULAR

## 2012-01-02 NOTE — Telephone Encounter (Signed)
Patient call back to make appt for 01/03/12 to have monitor put on.

## 2012-01-02 NOTE — Patient Instructions (Addendum)
blood tests (and a 24-HR urine test) are requested for you today.  please call [phone] to hear your test results.  You will be prompted to enter the 9-digit "MRN" number that appears at the top left of this page, followed by #.  Then you will hear the message. Reduce benicar to 1/2 of a 20 mg pill, daily.  (update: i left message on phone-tree:  rx as we discussed)

## 2012-01-02 NOTE — Progress Notes (Signed)
Subjective:    Patient ID: Alejandro Turner, male    DOB: Oct 25, 1961, 51 y.o.   MRN: [id number]  HPI Pt states few mos of intermittent moderate to severe dizziness sensation in the head, and assoc fatigue.  He gets these episodes daily, lasting approx 20 minutes, and he is unable to cite precip factor.  The episodes are not related to meals.  He has a cbg meter.  He has checked during an episode (approx 90). Past Medical History  Diagnosis Date  . Hypertension   . High cholesterol   . Borderline diabetes   . GERD (gastroesophageal reflux disease)   . History of kidney stones   . Allergy     Past Surgical History  Procedure Date  . Cardiac catheterization     May 2011  . Back surgery 2005  . Hernia repair   . Left shoulder surgery 1992  . Nose surgery 2005    History   Social History  . Marital Status: Married    Spouse Name: N/A    Number of Children: N/A  . Years of Education: N/A   Occupational History  . 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 Smoking status: Never Smoker   . Smokeless tobacco: Never Used  . Alcohol Use: No  . Drug Use: No  . Sexually Active: Yes   Other Topics Concern  . Not on file   Social History Narrative   Non smokerNon drinkerSedentaryMoved from [place] ago    Current Outpatient Prescriptions on File Prior to Visit  Medication Sig Dispense Refill  . aspirin EC 81 MG tablet Take 81 mg by mouth daily.      Marland Kitchen atorvastatin (LIPITOR) 10 MG tablet Take 10 mg by mouth daily.      . Cholecalciferol (VITAMIN D) 2000 UNITS tablet Take 2,000 Units by mouth daily.      . naproxen (NAPROSYN) 250 MG tablet Take 1 tablet (250 mg total) by mouth 2 (two) times daily as needed. For pain.      Marland Kitchen olmesartan (BENICAR) 20 MG tablet Take 20 mg by mouth daily.      Marland Kitchen omeprazole (PRILOSEC) 40 MG capsule Take 40 mg by mouth daily.        Allergies  Allergen Reactions  . Contrast Media (Iodinated Diagnostic Agents) Rash  .  Food Rash    Soy beans  . Shrimp (Shellfish Allergy) Hives    Family History  Problem Relation Age of Onset  . Kidney disease Mother   . Hypertension Mother   . Heart disease Mother   . Hyperlipidemia Mother   . Cancer Mother     Ovarian Cancer, Breast Cancer  . Diabetes Father   . Hypertension Father   . Heart disease Father   . Hyperlipidemia Father   . Heart disease Sister   . Hyperlipidemia Sister   . Kidney disease Brother     BP 104/62  Pulse 66  Temp(Src) 97.1 F (36.2 C) (Oral)  Ht 5\' 5"  (1.651 m)  Wt 184 lb (83.462 kg)  BMI 30.62 kg/m2  SpO2 96%  Review of Systems He reports headache, chest pain, nausea, left shoulder pain, flushing, blurry vision, bilat leg weakness, palpitations, and excessive diaphoresis.   He denies sob, weight change, anxiety, diarrhea, syncope, and fever.     Objective:   Physical Exam VS: see vs page GEN: no distress HEAD: head: no deformity eyes: no periorbital swelling,  no proptosis external nose and ears are normal mouth: no lesion seen NECK: supple, thyroid is not enlarged CHEST WALL: no deformity LUNGS: clear to auscultation CV: reg rate and rhythm, no murmur ABD: abdomen is soft, nontender.  no hepatosplenomegaly.  not distended.  no hernia MUSCULOSKELETAL: muscle bulk and strength are grossly normal.  no obvious joint swelling.  gait is normal and steady.  Left shoulder ROM is limited by pain. EXTEMITIES: no deformity of the hands. no edema of the legs PULSES: dorsalis pedis intact bilat.  no carotid bruit NEURO:  cn 2-12 grossly intact.   readily moves all 4's.  sensation is intact to touch on the feet.  No tremor SKIN:  Normal texture and temperature.  No rash or suspicious lesion is visible.  Not diaphoretic.   NODES:  None palpable at the neck PSYCH: alert, oriented x3.  Does not appear anxious nor depressed.   acth stimulation test is done: baseline cortisol level=6 then cosyntropin 250 mcg is given im 45 minutes  later, cortisol level=23 (normal response)    Assessment & Plan:  Hyponatremia, not adrenal-related Dizziness, uncertain etiology.  Hypoglycemia is excluded by his cbg reading HTN, slightly overcontrolled

## 2012-01-03 ENCOUNTER — Emergency Department (HOSPITAL_COMMUNITY): Payer: Self-pay

## 2012-01-03 ENCOUNTER — Telehealth: Payer: Self-pay | Admitting: Internal Medicine

## 2012-01-03 ENCOUNTER — Emergency Department (HOSPITAL_COMMUNITY)
Admission: EM | Admit: 2012-01-03 | Discharge: 2012-01-03 | Disposition: A | Discharge status: Home / Self Care | Payer: Self-pay | Attending: Emergency Medicine | Admitting: Emergency Medicine

## 2012-01-03 ENCOUNTER — Encounter (HOSPITAL_COMMUNITY): Payer: Self-pay | Admitting: Emergency Medicine

## 2012-01-03 DIAGNOSIS — M79609 Pain in unspecified limb: Secondary | ICD-10-CM | POA: Insufficient documentation

## 2012-01-03 DIAGNOSIS — K219 Gastro-esophageal reflux disease without esophagitis: Secondary | ICD-10-CM | POA: Insufficient documentation

## 2012-01-03 DIAGNOSIS — M25519 Pain in unspecified shoulder: Secondary | ICD-10-CM | POA: Insufficient documentation

## 2012-01-03 DIAGNOSIS — R51 Headache: Secondary | ICD-10-CM | POA: Insufficient documentation

## 2012-01-03 DIAGNOSIS — R42 Dizziness and giddiness: Secondary | ICD-10-CM | POA: Insufficient documentation

## 2012-01-03 DIAGNOSIS — Z79899 Other long term (current) drug therapy: Secondary | ICD-10-CM | POA: Insufficient documentation

## 2012-01-03 DIAGNOSIS — I10 Essential (primary) hypertension: Secondary | ICD-10-CM | POA: Insufficient documentation

## 2012-01-03 DIAGNOSIS — Z87442 Personal history of urinary calculi: Secondary | ICD-10-CM | POA: Insufficient documentation

## 2012-01-03 DIAGNOSIS — E789 Disorder of lipoprotein metabolism, unspecified: Secondary | ICD-10-CM | POA: Insufficient documentation

## 2012-01-03 DIAGNOSIS — R110 Nausea: Secondary | ICD-10-CM | POA: Insufficient documentation

## 2012-01-03 DIAGNOSIS — Z7982 Long term (current) use of aspirin: Secondary | ICD-10-CM | POA: Insufficient documentation

## 2012-01-03 DIAGNOSIS — R079 Chest pain, unspecified: Secondary | ICD-10-CM | POA: Insufficient documentation

## 2012-01-03 LAB — COMPREHENSIVE METABOLIC PANEL WITH GFR
ALT: 20 U/L (ref 0–53)
AST: 17 U/L (ref 0–37)
Albumin: 4.5 g/dL (ref 3.5–5.2)
Alkaline Phosphatase: 62 U/L (ref 39–117)
BUN: 13 mg/dL (ref 6–23)
CO2: 27 meq/L (ref 19–32)
Calcium: 10 mg/dL (ref 8.4–10.5)
Chloride: 101 meq/L (ref 96–112)
Creatinine, Ser: 0.99 mg/dL (ref 0.50–1.35)
GFR calc Af Amer: >90 mL/min
GFR calc non Af Amer: >90 mL/min
Glucose, Bld: 104 mg/dL — ABNORMAL HIGH (ref 70–99)
Potassium: 4.2 meq/L (ref 3.5–5.1)
Sodium: 136 meq/L (ref 135–145)
Total Bilirubin: 0.3 mg/dL (ref 0.3–1.2)
Total Protein: 7.8 g/dL (ref 6.0–8.3)

## 2012-01-03 LAB — CBC
Hemoglobin: 15.1 g/dL (ref 13.0–17.0)
MCH: 31.7 pg (ref 26.0–34.0)
MCHC: 34.9 g/dL (ref 30.0–36.0)
MCV: 90.8 fL (ref 78.0–100.0)
RBC: 4.77 MIL/uL (ref 4.22–5.81)

## 2012-01-03 LAB — TROPONIN I
Troponin I: <0.3 ng/mL
Troponin I: <0.3 ng/mL (ref <0.30)

## 2012-01-03 LAB — DIFFERENTIAL
Eosinophils Absolute: 0.1 10*3/uL (ref 0.0–0.7)
Eosinophils Relative: 2 % (ref 0–5)
Lymphs Abs: 1.7 10*3/uL (ref 0.7–4.0)
Monocytes Absolute: 0.6 10*3/uL (ref 0.1–1.0)
Monocytes Relative: 12 % (ref 3–12)

## 2012-01-03 LAB — CORTISOL: Cortisol, Plasma: 22.7 ug/dL

## 2012-01-03 MED ORDER — SODIUM CHLORIDE 0.9 % IV SOLN: INTRAVENOUS | Status: DC

## 2012-01-03 MED ORDER — HYDROCODONE-ACETAMINOPHEN 5-325 MG PO TABS: 1.0000 | ORAL_TABLET | Freq: Four times a day (QID) | ORAL | Status: AC | PRN

## 2012-01-03 NOTE — Telephone Encounter (Signed)
FYI

## 2012-01-03 NOTE — Discharge Instructions (Signed)
Call of our cardiology Dr. Swaziland for followup in the office. Continue take your aspirin daily. Workup today negative for any acute cardiac event. Return for new or worse symptoms.

## 2012-01-03 NOTE — Telephone Encounter (Signed)
Reason for Call:  Caller: Maridel/Spouse; PCP: Marga Melnick; CB#: [phone]; ; ; Call regarding Chest Pain/Chest Discomfort; Calling to get message to Dr Drue Novel that patient has chest pain, did not call 911. She is driving, arriving at [facility]. Just wanting message to get to Dr Drue Novel.

## 2012-01-03 NOTE — ED Notes (Signed)
Patient is being discharge with wife.  Pt/Wife verbalized understanding of outpt follow up.  Prescriptions reviewed with both pt/family member

## 2012-01-03 NOTE — ED Notes (Signed)
Left shoulder pain and cp x 2 months saw [facility] and was admitted for chest pain

## 2012-01-03 NOTE — ED Provider Notes (Addendum)
History     CSN: [id number]  Arrival date & time 01/03/12  1256   None     Chief Complaint  Patient presents with  . Shoulder Pain    (Consider location/radiation/quality/duration/timing/severity/associated sxs/prior treatment) Patient is a 51 y.o. male presenting with chest pain.  Chest Pain The chest pain began 3 - 5 hours ago. Chest pain occurs intermittently. The chest pain is unchanged. At its most intense, the pain is at 8/10. The severity of the pain is moderate. The quality of the pain is described as aching and sharp. The pain radiates to the left arm and left shoulder. Exacerbated by: Nothing. Primary symptoms include nausea and dizziness. Pertinent negatives for primary symptoms include no fever, no syncope, no shortness of breath, no cough, no palpitations, no abdominal pain, no vomiting and no altered mental status.  Dizziness also occurs with nausea. Dizziness does not occur with vomiting.      Past Medical History  Diagnosis Date  . Hypertension   . High cholesterol   . Borderline diabetes   . GERD (gastroesophageal reflux disease)   . History of kidney stones   . Allergy     Past Surgical History  Procedure Date  . Cardiac catheterization     May 2011  . Back surgery 2005  . Hernia repair   . Left shoulder surgery 1992  . Nose surgery 2005    Family History  Problem Relation Age of Onset  . Kidney disease Mother   . Hypertension Mother   . Heart disease Mother   . Hyperlipidemia Mother   . Cancer Mother     Ovarian Cancer, Breast Cancer  . Diabetes Father   . Hypertension Father   . Heart disease Father   . Hyperlipidemia Father   . Heart disease Sister   . Hyperlipidemia Sister   . Kidney disease Brother     History  Substance Use Topics  . Smoking status: Never Smoker   . Smokeless tobacco: Never Used  . Alcohol Use: No      Review of Systems  Constitutional: Negative for fever.  HENT: Negative for congestion, neck pain and neck  stiffness.   Eyes: Negative for visual disturbance.  Respiratory: Negative for cough and shortness of breath.   Cardiovascular: Positive for chest pain. Negative for palpitations and syncope.  Gastrointestinal: Positive for nausea. Negative for vomiting and abdominal pain.  Genitourinary: Negative for dysuria.  Musculoskeletal: Negative for back pain.  Neurological: Positive for dizziness and headaches. Negative for syncope.  Hematological: Does not bruise/bleed easily.  Psychiatric/Behavioral: Negative for altered mental status.    Allergies  Contrast media; Food; and Shrimp  Home Medications   Current Outpatient Rx  Name Route Sig Dispense Refill  . ASPIRIN EC 81 MG PO TBEC Oral Take 81 mg by mouth daily.    . ATORVASTATIN CALCIUM 10 MG PO TABS Oral Take 10 mg by mouth daily.    Marland Kitchen VITAMIN D 2000 UNITS PO TABS Oral Take 2,000 Units by mouth daily.    Marland Kitchen NAPROXEN 250 MG PO TABS Oral Take 250 mg by mouth 2 (two) times daily as needed. For pain.    Marland Kitchen OLMESARTAN MEDOXOMIL 20 MG PO TABS Oral Take 10 mg by mouth daily.     Marland Kitchen OMEPRAZOLE 40 MG PO CPDR Oral Take 40 mg by mouth daily.      BP 106/76  Pulse 61  Temp 98.7 F (37.1 C)  Resp 13  SpO2 100%  Physical Exam  Nursing note and vitals reviewed. Constitutional: He is oriented to person, place, and time. He appears well-developed and well-nourished. No distress.  HENT:  Head: Normocephalic and atraumatic.  Mouth/Throat: Oropharynx is clear and moist.  Eyes: Conjunctivae and EOM are normal. Pupils are equal, round, and reactive to light.  Neck: Normal range of motion. Neck supple.  Cardiovascular: Normal rate, regular rhythm and normal heart sounds.   No murmur heard. Pulmonary/Chest: Effort normal and breath sounds normal. No respiratory distress. He has no wheezes. He has no rales. He exhibits no tenderness.  Abdominal: Soft. Bowel sounds are normal. There is no tenderness.  Musculoskeletal: Normal range of motion. He  exhibits no edema.  Neurological: He is alert and oriented to person, place, and time. No cranial nerve deficit. He exhibits normal muscle tone. Coordination normal.  Skin: Skin is warm. No rash noted.    ED Course  Procedures (including critical care time)  Labs Reviewed  COMPREHENSIVE METABOLIC PANEL - Abnormal; Notable for the following:    Glucose, Bld 104 (*)    All other components within normal limits  CBC  DIFFERENTIAL  TROPONIN I  TROPONIN I   Dg Chest 2 View  01/03/2012  *RADIOLOGY REPORT*  Clinical Data: Chest pain.  CHEST - 2 VIEW  Comparison: 12/17/2011  Findings: Heart and mediastinal contours are within normal limits. No focal opacities or effusions.  No acute bony abnormality.  IMPRESSION: No active cardiopulmonary disease.  Original Report Authenticated By: Cyndie Chime, M.D.   Results for orders placed during the hospital encounter of 01/03/12  CBC      Component Value Range   WBC 5.0  4.0 - 10.5 (K/uL)   RBC 4.77  4.22 - 5.81 (MIL/uL)   Hemoglobin 15.1  13.0 - 17.0 (g/dL)   HCT 40.9  81.1 - 91.4 (%)   MCV 90.8  78.0 - 100.0 (fL)   MCH 31.7  26.0 - 34.0 (pg)   MCHC 34.9  30.0 - 36.0 (g/dL)   RDW 78.2  95.6 - 21.3 (%)   Platelets 239  150 - 400 (K/uL)  DIFFERENTIAL      Component Value Range   Neutrophils Relative 53  43 - 77 (%)   Neutro Abs 2.6  1.7 - 7.7 (K/uL)   Lymphocytes Relative 33  12 - 46 (%)   Lymphs Abs 1.7  0.7 - 4.0 (K/uL)   Monocytes Relative 12  3 - 12 (%)   Monocytes Absolute 0.6  0.1 - 1.0 (K/uL)   Eosinophils Relative 2  0 - 5 (%)   Eosinophils Absolute 0.1  0.0 - 0.7 (K/uL)   Basophils Relative 0  0 - 1 (%)   Basophils Absolute 0.0  0.0 - 0.1 (K/uL)  COMPREHENSIVE METABOLIC PANEL      Component Value Range   Sodium 136  135 - 145 (mEq/L)   Potassium 4.2  3.5 - 5.1 (mEq/L)   Chloride 101  96 - 112 (mEq/L)   CO2 27  19 - 32 (mEq/L)   Glucose, Bld 104 (*) 70 - 99 (mg/dL)   BUN 13  6 - 23 (mg/dL)   Creatinine, Ser 0.86  0.50 -  1.35 (mg/dL)   Calcium 57.8  8.4 - 10.5 (mg/dL)   Total Protein 7.8  6.0 - 8.3 (g/dL)   Albumin 4.5  3.5 - 5.2 (g/dL)   AST 17  0 - 37 (U/L)   ALT 20  0 - 53 (U/L)   Alkaline Phosphatase 62  39 - 117 (U/L)   Total Bilirubin 0.3  0.3 - 1.2 (mg/dL)   GFR calc non Af Amer >90  >90 (mL/min)   GFR calc Af Amer >90  >90 (mL/min)  TROPONIN I      Component Value Range   Troponin I <0.30  <0.30 (ng/mL)  TROPONIN I      Component Value Range   Troponin I <0.30  <0.30 (ng/mL)    Date: 01/03/2012  Rate: 65  Rhythm: normal sinus rhythm  QRS Axis: right  Intervals: normal  ST/T Wave abnormalities: normal  Conduction Disutrbances:none  Narrative Interpretation:   Old EKG Reviewed: unchanged  Unchanged from 12/18/2011  1. Chest pain       MDM  Patient with persistent chest pain more severe today since about 12 noon patient was admitted at [facility] long for unstable angina on January 27th had a stress test which was negative since that time and discharge patient has persisted with pain on and off as stated above worse today. Initial troponin and EKG without acute changes chest x-ray negative. Review of his past medical history shows the patient had a cardiac catheterization done in May 2011 she does not remember the results but thought something was wrong with it. We'll check the chart to see what the results of that were.   Review of the chart not able to come up with a cardiac cath results may be was not done here.  We'll consult internal medicine consideration for admission and reconsideration whether cardiac catheterization is to be repeated. Patient currently without any chest pain did take aspirin earlier today. History of anything is consistent with unstable angina.  Discussed with hospitalist triad as well as Peter Swaziland from cardiology. Specific cardiac catheterization results from [place] where was done or not present however paperwork about it being done states that it was  really done in 2010 fall of 2010. Again with this much chest pain going on for such a long period of time one would expect some change in troponin if there is truly unstable angina. Both cardiology and I agree that this is not likely to be directly related to coronary artery disease. Amer 2 negative prostate can be discharged home and followup in the office.        Shelda Jakes, MD 01/03/12 1721  Shelda Jakes, MD 01/03/12 1610  Shelda Jakes, MD 01/05/12 1141

## 2012-01-04 ENCOUNTER — Other Ambulatory Visit: Payer: Self-pay

## 2012-01-04 DIAGNOSIS — I10 Essential (primary) hypertension: Secondary | ICD-10-CM

## 2012-01-04 NOTE — Telephone Encounter (Signed)
noted 

## 2012-01-08 LAB — METANEPHRINES, URINE, 24 HOUR
Metaneph Total, Ur: 493 mcg/24 h (ref 224–832)
Metanephrines, Ur: 155 mcg/24 h (ref 90–315)
Normetanephrine, 24H Ur: 338 mcg/24 h (ref 122–676)

## 2012-01-09 LAB — CATECHOLAMINES, FRACTIONATED, URINE, 24 HOUR
Calculated Total (E+NE): 72 mcg/24 h (ref 26–121)
Total Volume - CF 24Hr U: 3000 mL

## 2012-01-12 ENCOUNTER — Encounter (INDEPENDENT_AMBULATORY_CARE_PROVIDER_SITE_OTHER): Payer: Self-pay

## 2012-01-12 DIAGNOSIS — R5383 Other fatigue: Secondary | ICD-10-CM

## 2012-01-12 DIAGNOSIS — R5381 Other malaise: Secondary | ICD-10-CM

## 2012-03-18 ENCOUNTER — Ambulatory Visit: Payer: Self-pay | Admitting: Internal Medicine

## 2015-05-07 ENCOUNTER — Emergency Department (HOSPITAL_COMMUNITY): Payer: Managed Care, Other (non HMO)

## 2015-05-07 ENCOUNTER — Inpatient Hospital Stay (HOSPITAL_COMMUNITY)
Admission: EM | Admit: 2015-05-07 | Discharge: 2015-05-10 | DRG: 287 | Disposition: A | Discharge status: Home / Self Care | Payer: Managed Care, Other (non HMO) | Attending: Cardiovascular Disease | Admitting: Cardiovascular Disease

## 2015-05-07 ENCOUNTER — Encounter (HOSPITAL_COMMUNITY): Payer: Self-pay | Admitting: Physical Medicine and Rehabilitation

## 2015-05-07 DIAGNOSIS — I252 Old myocardial infarction: Secondary | ICD-10-CM | POA: Diagnosis not present

## 2015-05-07 DIAGNOSIS — E871 Hypo-osmolality and hyponatremia: Secondary | ICD-10-CM | POA: Diagnosis present

## 2015-05-07 DIAGNOSIS — R7303 Prediabetes: Secondary | ICD-10-CM | POA: Diagnosis present

## 2015-05-07 DIAGNOSIS — Z7982 Long term (current) use of aspirin: Secondary | ICD-10-CM

## 2015-05-07 DIAGNOSIS — E782 Mixed hyperlipidemia: Secondary | ICD-10-CM | POA: Diagnosis present

## 2015-05-07 DIAGNOSIS — R112 Nausea with vomiting, unspecified: Secondary | ICD-10-CM | POA: Diagnosis present

## 2015-05-07 DIAGNOSIS — N289 Disorder of kidney and ureter, unspecified: Secondary | ICD-10-CM | POA: Diagnosis not present

## 2015-05-07 DIAGNOSIS — R079 Chest pain, unspecified: Secondary | ICD-10-CM | POA: Diagnosis not present

## 2015-05-07 DIAGNOSIS — I4510 Unspecified right bundle-branch block: Secondary | ICD-10-CM | POA: Diagnosis present

## 2015-05-07 DIAGNOSIS — I25110 Atherosclerotic heart disease of native coronary artery with unstable angina pectoris: Secondary | ICD-10-CM | POA: Diagnosis present

## 2015-05-07 DIAGNOSIS — Z79899 Other long term (current) drug therapy: Secondary | ICD-10-CM | POA: Diagnosis not present

## 2015-05-07 DIAGNOSIS — Z91013 Allergy to seafood: Secondary | ICD-10-CM | POA: Diagnosis not present

## 2015-05-07 DIAGNOSIS — R002 Palpitations: Secondary | ICD-10-CM | POA: Diagnosis present

## 2015-05-07 DIAGNOSIS — K219 Gastro-esophageal reflux disease without esophagitis: Secondary | ICD-10-CM | POA: Diagnosis present

## 2015-05-07 DIAGNOSIS — Z91018 Allergy to other foods: Secondary | ICD-10-CM | POA: Diagnosis not present

## 2015-05-07 DIAGNOSIS — I10 Essential (primary) hypertension: Secondary | ICD-10-CM | POA: Diagnosis present

## 2015-05-07 DIAGNOSIS — Z91041 Radiographic dye allergy status: Secondary | ICD-10-CM

## 2015-05-07 DIAGNOSIS — R7309 Other abnormal glucose: Secondary | ICD-10-CM | POA: Diagnosis present

## 2015-05-07 DIAGNOSIS — Z791 Long term (current) use of non-steroidal anti-inflammatories (NSAID): Secondary | ICD-10-CM

## 2015-05-07 DIAGNOSIS — Z91048 Other nonmedicinal substance allergy status: Secondary | ICD-10-CM | POA: Diagnosis not present

## 2015-05-07 HISTORY — DX: Headache: R51

## 2015-05-07 HISTORY — DX: Chest pain, unspecified: R07.9

## 2015-05-07 HISTORY — DX: Personal history of peptic ulcer disease: Z87.11

## 2015-05-07 HISTORY — DX: Personal history of other diseases of the digestive system: Z87.19

## 2015-05-07 HISTORY — DX: Calculus of kidney: N20.0

## 2015-05-07 HISTORY — DX: Headache, unspecified: R51.9

## 2015-05-07 LAB — CBC WITH DIFFERENTIAL/PLATELET
BASOS ABS: 0 10*3/uL (ref 0.0–0.1)
BASOS PCT: 0 % (ref 0–1)
Eosinophils Absolute: 0.1 10*3/uL (ref 0.0–0.7)
Eosinophils Relative: 1 % (ref 0–5)
HEMATOCRIT: 44.8 % (ref 39.0–52.0)
HEMOGLOBIN: 15.6 g/dL (ref 13.0–17.0)
LYMPHS PCT: 31 % (ref 12–46)
Lymphs Abs: 1.6 10*3/uL (ref 0.7–4.0)
MCH: 31.8 pg (ref 26.0–34.0)
MCHC: 34.8 g/dL (ref 30.0–36.0)
MCV: 91.4 fL (ref 78.0–100.0)
Monocytes Absolute: 0.5 10*3/uL (ref 0.1–1.0)
Monocytes Relative: 9 % (ref 3–12)
NEUTROS ABS: 3.1 10*3/uL (ref 1.7–7.7)
NEUTROS PCT: 59 % (ref 43–77)
Platelets: 242 10*3/uL (ref 150–400)
RBC: 4.9 MIL/uL (ref 4.22–5.81)
RDW: 13.8 % (ref 11.5–15.5)
WBC: 5.3 10*3/uL (ref 4.0–10.5)

## 2015-05-07 LAB — TROPONIN I

## 2015-05-07 LAB — COMPREHENSIVE METABOLIC PANEL
ALBUMIN: 4.3 g/dL (ref 3.5–5.0)
ALK PHOS: 60 U/L (ref 38–126)
ALT: 46 U/L (ref 17–63)
ANION GAP: 9 (ref 5–15)
AST: 33 U/L (ref 15–41)
BILIRUBIN TOTAL: 0.6 mg/dL (ref 0.3–1.2)
BUN: 17 mg/dL (ref 6–20)
CHLORIDE: 104 mmol/L (ref 101–111)
CO2: 23 mmol/L (ref 22–32)
Calcium: 9.8 mg/dL (ref 8.9–10.3)
Creatinine, Ser: 1.12 mg/dL (ref 0.61–1.24)
GFR calc Af Amer: >60 mL/min (ref >60)
Glucose, Bld: 118 mg/dL — ABNORMAL HIGH (ref 65–99)
POTASSIUM: 4.3 mmol/L (ref 3.5–5.1)
Sodium: 136 mmol/L (ref 135–145)
Total Protein: 8.1 g/dL (ref 6.5–8.1)

## 2015-05-07 LAB — I-STAT TROPONIN, ED: TROPONIN I, POC: 0.01 ng/mL (ref 0.00–0.08)

## 2015-05-07 LAB — BRAIN NATRIURETIC PEPTIDE: B NATRIURETIC PEPTIDE 5: 3.5 pg/mL (ref 0.0–100.0)

## 2015-05-07 MED ORDER — NITROGLYCERIN 0.4 MG SL SUBL
0.4000 mg | SUBLINGUAL_TABLET | SUBLINGUAL | Status: DC | PRN
  Administered 2015-05-07 – 2015-05-08 (×5): 0.4 mg via SUBLINGUAL
  Filled 2015-05-07 (×2): qty 1

## 2015-05-07 MED ORDER — ASPIRIN 81 MG PO CHEW
162.0000 mg | CHEWABLE_TABLET | Freq: Once | ORAL | Status: AC
  Administered 2015-05-07: 162 mg via ORAL
  Filled 2015-05-07: qty 2

## 2015-05-07 MED ORDER — METOPROLOL TARTRATE 12.5 MG HALF TABLET
12.5000 mg | ORAL_TABLET | Freq: Two times a day (BID) | ORAL | Status: DC
  Administered 2015-05-07 – 2015-05-10 (×6): 12.5 mg via ORAL
  Filled 2015-05-07 (×7): qty 1

## 2015-05-07 MED ORDER — ASPIRIN EC 81 MG PO TBEC: 81.0000 mg | DELAYED_RELEASE_TABLET | Freq: Every day | ORAL | Status: DC

## 2015-05-07 MED ORDER — IRBESARTAN 150 MG PO TABS
150.0000 mg | ORAL_TABLET | Freq: Every day | ORAL | Status: DC
  Administered 2015-05-07 – 2015-05-10 (×4): 150 mg via ORAL
  Filled 2015-05-07 (×4): qty 1

## 2015-05-07 MED ORDER — OMEGA-3-ACID ETHYL ESTERS 1 G PO CAPS
1.0000 g | ORAL_CAPSULE | Freq: Every evening | ORAL | Status: DC
  Administered 2015-05-07 – 2015-05-09 (×3): 1 g via ORAL
  Filled 2015-05-07 (×4): qty 1

## 2015-05-07 MED ORDER — PANTOPRAZOLE SODIUM 40 MG PO TBEC
40.0000 mg | DELAYED_RELEASE_TABLET | Freq: Every day | ORAL | Status: DC
  Administered 2015-05-08 – 2015-05-10 (×3): 40 mg via ORAL
  Filled 2015-05-07 (×3): qty 1

## 2015-05-07 MED ORDER — HEPARIN SODIUM (PORCINE) 5000 UNIT/ML IJ SOLN
5000.0000 [IU] | Freq: Three times a day (TID) | INTRAMUSCULAR | Status: DC
  Administered 2015-05-07 – 2015-05-10 (×7): 5000 [IU] via SUBCUTANEOUS
  Filled 2015-05-07 (×12): qty 1

## 2015-05-07 MED ORDER — NITROGLYCERIN IN D5W 200-5 MCG/ML-% IV SOLN: 3.0000 ug/min | INTRAVENOUS | Status: DC

## 2015-05-07 MED ORDER — EZETIMIBE 10 MG PO TABS
10.0000 mg | ORAL_TABLET | Freq: Every day | ORAL | Status: DC
  Administered 2015-05-07 – 2015-05-10 (×4): 10 mg via ORAL
  Filled 2015-05-07 (×4): qty 1

## 2015-05-07 MED ORDER — ASPIRIN EC 81 MG PO TBEC
81.0000 mg | DELAYED_RELEASE_TABLET | Freq: Every day | ORAL | Status: DC
Start: 2015-05-08 — End: 2015-05-09
  Administered 2015-05-08 – 2015-05-09 (×2): 81 mg via ORAL
  Filled 2015-05-07 (×2): qty 1

## 2015-05-07 MED ORDER — NITROGLYCERIN IN D5W 200-5 MCG/ML-% IV SOLN
5.0000 ug/min | Freq: Once | INTRAVENOUS | Status: AC
  Administered 2015-05-07: 5 ug/min via INTRAVENOUS
  Filled 2015-05-07: qty 250

## 2015-05-07 MED ORDER — GABAPENTIN 300 MG PO CAPS
300.0000 mg | ORAL_CAPSULE | Freq: Every day | ORAL | Status: DC | PRN
  Filled 2015-05-07: qty 1

## 2015-05-07 MED ORDER — ONDANSETRON HCL 4 MG/2ML IJ SOLN: 4.0000 mg | Freq: Four times a day (QID) | INTRAMUSCULAR | Status: DC | PRN

## 2015-05-07 MED ORDER — ASPIRIN 300 MG RE SUPP
300.0000 mg | RECTAL | Status: AC
  Filled 2015-05-07: qty 1

## 2015-05-07 MED ORDER — ASPIRIN 81 MG PO CHEW: 324.0000 mg | CHEWABLE_TABLET | ORAL | Status: AC

## 2015-05-07 MED ORDER — ATORVASTATIN CALCIUM 10 MG PO TABS
10.0000 mg | ORAL_TABLET | Freq: Every day | ORAL | Status: DC
  Administered 2015-05-07 – 2015-05-10 (×4): 10 mg via ORAL
  Filled 2015-05-07 (×4): qty 1

## 2015-05-07 MED ORDER — ACETAMINOPHEN 325 MG PO TABS: 650.0000 mg | ORAL_TABLET | ORAL | Status: DC | PRN

## 2015-05-07 NOTE — Plan of Care (Signed)
Problem: Phase I Progression Outcomes Goal: Anginal pain relieved Outcome: Progressing Pt. On nitroglycerin drip

## 2015-05-07 NOTE — H&P (Signed)
Patient ID: Alejandro Turner MRN: [id number], DOB/AGE: Apr 06, 1961   Admit date: 05/07/2015   Primary Physician: Willow Ora, MD Primary Cardiologist: [facility] (followed in [place])                                       Dr. Tresa Endo  Pt. 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   Problem List  Past Medical History  Diagnosis Date  . Hypertension   . High cholesterol   . Borderline diabetes   . GERD (gastroesophageal reflux disease)   . History of kidney stones   . Allergy     Past Surgical History  Procedure Laterality Date  . Cardiac catheterization      May 2011  . Back surgery  2005  . Hernia repair    . Left shoulder surgery  1992  . Nose surgery  2005     Allergies  Allergies  Allergen Reactions  . Contrast Media [Iodinated Diagnostic Agents] Rash  . Food Rash    Soy beans  . Shrimp [Shellfish Allergy] Hives  . Tree Extract Other (See Comments)    Intolerance to all plants, trees and grass    HPI  The patient is a 54 y/o, predominately Spanish speaking male. He speaks and understands some [place]. He resides in [place] but is in [place] visiting family. He has a h/o CAD. He reports that he had a LHC 5 years ago in [place] and was told he had nonobstructive CAD and did not require PCI. He also has h/o treated HTH and HLD. He his pre-diabetic, diet controlled. He was recently seen by his cardiologist in [place] and noted recurrent CP. He is scheduled for a repeat LHC 05/27/15, however his cardiologist told him to go directly to the closest ED if he had recurrent CP.  He presents to [name] today with complaints of recurrent left sided chest pressure and left arm pain. Symptoms are worse with exertion and relieved with rest. He also notes DOE and near syncope. No resting dyspnea and no frank syncope. In the ED, he is currently pain free on IV nitro. Initial troponin is negative. EKG shows NSR with  incomplete RBBB. CBC normal. Glucose is 118 but otherwise BMP is unremarkable. CXR normal.    Home Medications  Prior to Admission medications   Medication Sig Start Date End Date Taking? Authorizing Provider  aspirin EC 81 MG tablet Take 81 mg by mouth daily.   Yes Historical Provider, MD  atorvastatin (LIPITOR) 10 MG tablet Take 10 mg by mouth daily.   Yes Historical Provider, MD  Cholecalciferol (VITAMIN D) 2000 UNITS tablet Take 2,000 Units by mouth daily.   Yes Historical Provider, MD  ezetimibe (ZETIA) 10 MG tablet Take 10 mg by mouth daily.   Yes Historical Provider, MD  gabapentin (NEURONTIN) 300 MG capsule Take 300 mg by mouth daily as needed (pain).   Yes Historical Provider, MD  naproxen sodium (ANAPROX) 220 MG tablet Take 220 mg by mouth 2 (two) times daily as needed (pain).   Yes Historical Provider, MD  olmesartan (BENICAR) 20 MG tablet Take 10 mg by mouth daily.    Yes Historical Provider, MD  omega-3 acid ethyl esters (LOVAZA) 1 G capsule Take 1 g by mouth every evening.   Yes  Historical Provider, MD  omeprazole (PRILOSEC) 40 MG capsule Take 40 mg by mouth daily.   Yes Historical Provider, MD    Family History  Family History  Problem Relation Age of Onset  . Kidney disease Mother   . Hypertension Mother   . Heart disease Mother   . Hyperlipidemia Mother   . Cancer Mother     Ovarian Cancer, Breast Cancer  . Diabetes Father   . Hypertension Father   . Heart disease Father   . Hyperlipidemia Father   . Heart disease Sister   . Hyperlipidemia Sister   . Kidney disease Brother     Social History  History   Social History  . Marital Status: Married    Spouse Name: N/A  . Number of Children: N/A  . Years of Education: N/A   Occupational History  . 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 Smoking status: Never Smoker   . Smokeless tobacco: Never Used  . Alcohol Use: No  . Drug Use: No  . Sexual Activity: Yes   Other Topics  Concern  . Not on file   Social History Narrative   Non smoker   Non drinker   Sedentary   Moved from [place] 6 months ago     Review of Systems General:  No chills, fever, night sweats or weight changes.  Cardiovascular:  No chest pain, dyspnea on exertion, edema, orthopnea, palpitations, paroxysmal nocturnal dyspnea. Dermatological: No rash, lesions/masses Respiratory: No cough, dyspnea Urologic: No hematuria, dysuria Abdominal:   No nausea, vomiting, diarrhea, bright red blood per rectum, melena, or hematemesis Neurologic:  No visual changes, wkns, changes in mental status. All other systems reviewed and are otherwise negative except as noted above.  Physical Exam  Blood pressure 124/69, pulse 62, temperature 98.2 F (36.8 C), temperature source Oral, resp. rate 12, height 5\' 5"  (1.651 m), weight 185 lb (83.915 kg), SpO2 99 %.  General: Pleasant, NAD Psych: Normal affect. Neuro: Alert and oriented X 3. Moves all extremities spontaneously. HEENT: Normal  Neck: Supple without bruits or JVD. Lungs:  Resp regular and unlabored, CTA. Heart: scaring over chest wall, RRR 1/6 systolic murmurs. Abdomen: Soft, non-tender, non-distended, BS + x 4.  Extremities: No clubbing, cyanosis or edema. DP/PT/Radials 2+ and equal bilaterally.  Labs  Troponin (Point of Care Test)  Recent Labs  05/07/15 1057  TROPIPOC 0.01   No results for input(s): CKTOTAL, CKMB, TROPONINI in the last 72 hours. Lab Results  Component Value Date   WBC 5.3 05/07/2015   HGB 15.6 05/07/2015   HCT 44.8 05/07/2015   MCV 91.4 05/07/2015   PLT 242 05/07/2015     Recent Labs Lab 05/07/15 1042  NA 136  K 4.3  CL 104  CO2 23  BUN 17  CREATININE 1.12  CALCIUM 9.8  PROT 8.1  BILITOT 0.6  ALKPHOS 60  ALT 46  AST 33  GLUCOSE 118*   No results found for: CHOL, HDL, LDLCALC, TRIG No results found for: DDIMER   Radiology/Studies  Dg Chest 2 View  05/07/2015   CLINICAL DATA:  Non radiating chest pain.  Onset yesterday. Shortness of breath.  EXAM: CHEST  2 VIEW  COMPARISON:  01/03/2012.  FINDINGS: The heart size and mediastinal contours are within normal limits. Both lungs are clear. The visualized skeletal structures are unremarkable.  IMPRESSION: No active cardiopulmonary disease.   Electronically Signed   By: Dale [place].D.  On: 05/07/2015 11:44    ECG  NSR with incomplete RBBB.    ASSESSMENT AND PLAN  Active Problems:   HTN (hypertension)   Mixed hyperlipidemia   Borderline diabetes   Chest pain with moderate risk for cardiac etiology  1. Chest Pain w/ Moderate Risk for Cardiac Etiology: Patient has known h/o CAD, although nonobstructive at time of last cath 5 years ago. Also with multiple other cardiac risk factors including HTN, HLD and pre-diabetes. He is scheduled to undergo repeat LHC in [place] on 05/27/15, however he now presents with recurrent exertional CP and dyspnea concerning for angina. Initial enzymes negative. EKG non ischemic. He is CP free on IV nitro. Will admit to telemetry and cycle CE x 3. Recheck EKG in the am. Will re-evaluate tomorrow and determine if LHC on Monday. Will adjust meds. He may benefit from BB therapy. MD to follow with further recommendations.    Signed, Robbie Lis, PA-C 05/07/2015, 4:05 PM  Patient seen and examined. Agree with assessment and plan. Mr Deery is a 54 year old gentleman who is originally from [place] and currently resides in [place], [place].  He has a history of hypertension as well as hyperlipidemia and has been  maintained on Benicar 20 mg in addition to atorvastatin 10 mg/Zetia 10 mg.  Reportedly, he was told of having possible small heart attack in the past.  He had undergone cardiac catheterization approximately 5 years ago at [facility] in [place] and apparently was not found to have significant obstructive disease.  He was not put on any nitrate or beta blocker therapy.  Subsequently  he has frequent stress  tests [place] 2 [place] a [place]  He recently developed increasing symptomatology with left arm, shoulder discomfort.  He notes discomfort with walking and admits to exertional shortness of breath.  Yesterday he drove from [place] to [place] to visit family.  He did experience lightheadedness and some left arm discomfort.  Today he had recurrent symptomatology presented to the emergency room for further evaluation.  His ECG in the emergency room revealed normal sinus rhythm, mild RV conduction delay, without significant ST segment changes.  He currently is on low-dose IV nitroglycerin and is pain-free.  Will plan to it.  Admit to telemetry, cycle cardiac enzymes and obtain serial ECG.  Will add very low-dose beta blocker, particularly with his exertional component of symptoms, and if blood pressure elevated, consider possible amlodipine  Although the patient is tentatively scheduled to undergo cardiac catheterization on 05/27/2015, with recurrent symptomatology it may be prudent to consider doing this on Monday in the hospital here [facility].  My colleagues will evaluate over the weekend and finalize clinical strategy.   Lennette Bihari, MD, [facility] 05/07/2015 4:07 PM

## 2015-05-07 NOTE — ED Notes (Signed)
Patient returned from xray.

## 2015-05-07 NOTE — ED Provider Notes (Signed)
Patient seen and evaluated. Reports episodes of pain over the last few years. Has had to essentially normal stress echocardiograms. States he saw his cartilages to [place] last week and was scheduled for outpatient angiogram. However, he came to town for his family members birthday. She been having pain almost continuously with exertion. Now somewhat at rest. Somewhat atypical in that it is sharp it involves his neck and head but left chest associated shortness of breath as well. Given nitroglycerin sublingual with incomplete improvement. However after nitroglycerin discontinued he had worsening pain. EKG shows no changes first troponin normal. Started on drip. Given aspirin. [facility]ilogy consulted.  Rolland Porter, MD 05/07/15 [phone]

## 2015-05-07 NOTE — ED Notes (Signed)
Pt reports L sided non radiating chest pain. Onset Thursday evening. 9/10 pain upon arrival to ED. Also states SOB. Pt is alert and oriented x4. Respirations unlabored.

## 2015-05-07 NOTE — ED Notes (Signed)
MD at bedside. 

## 2015-05-07 NOTE — ED Provider Notes (Signed)
CSN: [id number]     Arrival date & time 05/07/15  1001 History   First MD Initiated Contact with Patient 05/07/15 1007     Chief Complaint  Patient presents with  . Chest Pain   Alejandro Turner is a 54 y.o. male with a history of CAD, hypertension and hyperlipidemia who presents to the emergency department complaining of left-sided substernal chest pain which radiates into his left arm and neck ongoing since yesterday morning with associated shortness of breath. Patient reports yesterday morning around 6 AM he started having chest pain that is worse with exertion. He reports his chest pain is constant and gradually worse and he now rates his pain at 9 out of 10 and describes it as sharp. He reports his left arm feels heavy and radiates into his neck. He reports his pain is constant but fluctuates in intensity. He reports intermittent palpitations.  Chest pain worsens with exertion. He also reports nausea and vomited one time yesterday. Patient also reports a cough for the past 2-3 weeks with productive clear sputum. He reports taking 160 mg of aspirin today. Patient reports he has a history of an MI and has had a cardiac cath but no stents placed before. He reports he lives in [place] and his cardiologist in [place] scheduled him for repeat catheterization on July 7 of this year. The patient denies fevers, abdominal pain, leg pain, leg swelling, hematemesis, urinary symptoms, rashes, lightheadedness or dizziness.  (Consider location/radiation/quality/duration/timing/severity/associated sxs/prior Treatment) HPI  Past Medical History  Diagnosis Date  . Hypertension   . High cholesterol   . Borderline diabetes   . GERD (gastroesophageal reflux disease)   . History of kidney stones   . Allergy    Past Surgical History  Procedure Laterality Date  . Cardiac catheterization      May 2011  . Back surgery  2005  . Hernia repair    . Left shoulder surgery  1992  . Nose surgery  2005    Family History  Problem Relation Age of Onset  . Kidney disease Mother   . Hypertension Mother   . Heart disease Mother   . Hyperlipidemia Mother   . Cancer Mother     Ovarian Cancer, Breast Cancer  . Diabetes Father   . Hypertension Father   . Heart disease Father   . Hyperlipidemia Father   . Heart disease Sister   . Hyperlipidemia Sister   . Kidney disease Brother    History  Substance Use Topics  . Smoking status: Never Smoker   . Smokeless tobacco: Never Used  . Alcohol Use: No    Review of Systems  Constitutional: Negative for fever.  HENT: Negative for congestion and sore throat.   Eyes: Negative for visual disturbance.  Respiratory: Positive for shortness of breath. Negative for cough and wheezing.   Cardiovascular: Positive for chest pain and palpitations. Negative for leg swelling.  Gastrointestinal: Positive for nausea and vomiting. Negative for abdominal pain, diarrhea and blood in stool.  Genitourinary: Negative for dysuria.  Musculoskeletal: Negative for back pain and neck pain.  Skin: Negative for rash.  Neurological: Negative for dizziness, weakness, light-headedness, numbness and headaches.      Allergies  Contrast media; Food; Shrimp; and Tree extract  Home Medications   Prior to Admission medications   Medication Sig Start Date End Date Taking? Authorizing Provider  aspirin EC 81 MG tablet Take 81 mg by mouth daily.   Yes Historical Provider, MD  atorvastatin (LIPITOR)  10 MG tablet Take 10 mg by mouth daily.   Yes Historical Provider, MD  Cholecalciferol (VITAMIN D) 2000 UNITS tablet Take 2,000 Units by mouth daily.   Yes Historical Provider, MD  ezetimibe (ZETIA) 10 MG tablet Take 10 mg by mouth daily.   Yes Historical Provider, MD  gabapentin (NEURONTIN) 300 MG capsule Take 300 mg by mouth daily as needed (pain).   Yes Historical Provider, MD  naproxen sodium (ANAPROX) 220 MG tablet Take 220 mg by mouth 2 (two) times daily as needed (pain).    Yes Historical Provider, MD  olmesartan (BENICAR) 20 MG tablet Take 10 mg by mouth daily.    Yes Historical Provider, MD  omega-3 acid ethyl esters (LOVAZA) 1 G capsule Take 1 g by mouth every evening.   Yes Historical Provider, MD  omeprazole (PRILOSEC) 40 MG capsule Take 40 mg by mouth daily.   Yes Historical Provider, MD   BP 124/69 mmHg  Pulse 62  Temp(Src) 98.2 F (36.8 C) (Oral)  Resp 12  Ht 5\' 5"  (1.651 m)  Wt 185 lb (83.915 kg)  BMI 30.79 kg/m2  SpO2 99% Physical Exam  Constitutional: He is oriented to person, place, and time. He appears well-developed and well-nourished. No distress.  Nontoxic appearing.  HENT:  Head: Normocephalic and atraumatic.  Right Ear: External ear normal.  Left Ear: External ear normal.  Mouth/Throat: Oropharynx is clear and moist. No oropharyngeal exudate.  Eyes: Conjunctivae are normal. Pupils are equal, round, and reactive to light. Right eye exhibits no discharge. Left eye exhibits no discharge.  Neck: Normal range of motion. Neck supple. No JVD present. No tracheal deviation present.  Cardiovascular: Normal rate, regular rhythm, normal heart sounds and intact distal pulses.  Exam reveals no gallop and no friction rub.   No murmur heard. Bilateral radial, posterior tibialis and dorsalis pedis pulses are intact.    Pulmonary/Chest: Effort normal and breath sounds normal. No respiratory distress. He has no wheezes. He has no rales. He exhibits tenderness.  Patient has mild left-sided chest tenderness which does not completely reproduce his chest pain. Lungs are clear to auscultation bilaterally.  Abdominal: Soft. He exhibits no distension. There is no tenderness.  Musculoskeletal: He exhibits no edema or tenderness.  No lower extremity edema or tenderness. Good and equal grip strength bilaterally.  Lymphadenopathy:    He has no cervical adenopathy.  Neurological: He is alert and oriented to person, place, and time. Coordination normal.  Alert and  oriented 3. Sensation is intact his bilateral upper and lower extremities.   Skin: Skin is warm and dry. No rash noted. He is not diaphoretic. No erythema. No pallor.  Psychiatric: He has a normal mood and affect. His behavior is normal.  Nursing note and vitals reviewed.   ED Course  Procedures (including critical care time) Labs Review Labs Reviewed  COMPREHENSIVE METABOLIC PANEL - Abnormal; Notable for the following:    Glucose, Bld 118 (*)    All other components within normal limits  CBC WITH DIFFERENTIAL/PLATELET  BRAIN NATRIURETIC PEPTIDE  I-STAT TROPOININ, ED    Imaging Review Dg Chest 2 View  05/07/2015   CLINICAL DATA:  Non radiating chest pain. Onset yesterday. Shortness of breath.  EXAM: CHEST  2 VIEW  COMPARISON:  01/03/2012.  FINDINGS: The heart size and mediastinal contours are within normal limits. Both lungs are clear. The visualized skeletal structures are unremarkable.  IMPRESSION: No active cardiopulmonary disease.   Electronically Signed   By: Elsie Stain  M.D.   On: 05/07/2015 11:44     EKG Interpretation   Date/Time:  Friday May 07 2015 10:07:05 EDT Ventricular Rate:  69 PR Interval:  144 QRS Duration: 106 QT Interval:  388 QTC Calculation: 415 R Axis:   89 Text Interpretation:  Normal sinus rhythm Incomplete right bundle branch  block Borderline ECG Confirmed by Fayrene Fearing  MD, MARK ([id number]) on 05/07/2015  10:32:06 AM      Filed Vitals:   05/07/15 1530 05/07/15 1545 05/07/15 1550 05/07/15 1555  BP: 125/62 146/80 127/71 124/69  Pulse: 63 66 64 62  Temp:      TempSrc:      Resp: Height:      Weight:      SpO2: 99% 99% 99% 99%     MDM   Meds given in ED:  Medications  nitroGLYCERIN (NITROSTAT) SL tablet 0.4 mg (0.4 mg Sublingual Given 05/07/15 1109)  aspirin chewable tablet 162 mg (162 mg Oral Given 05/07/15 1054)  nitroGLYCERIN 50 mg in dextrose 5 % 250 mL (0.2 mg/mL) infusion (15 mcg/min Intravenous Rate/Dose Change 05/07/15 1557)     New Prescriptions   No medications on file    Final diagnoses:  Chest pain at rest    This is a 54 y.o. male with a history of CAD, hypertension and hyperlipidemia who presents to the emergency department complaining of left-sided substernal chest pain which radiates into his left arm and neck ongoing since yesterday morning with associated shortness of breath. Patient reports yesterday morning around 6 AM he started having chest pain that is worse with exertion. He reports his chest pain is constant and gradually worse and he now rates his pain at 9 out of 10 and describes it as sharp. He reports his left arm feels heavy and radiates into his neck. He reports his pain is constant but fluctuates in intensity.  The patient reports he lives in [place] and is followed by a cardiologist there. He was due to have a cardiac catheterization on July 7 of this year. On exam patient is afebrile and nontoxic appearing. He has no focal neurological deficits. Heart is regular rate and rhythm. EKG shows no changes from his previous. Chest x-ray is unremarkable. His BNP is within normal limits. CMP is unremarkable. CBC is within normal limits. His initial troponin is negative. Patient has concerning story with a heart score of 5. Patient had some relief of pain with initial nitroglycerin but then his pain returned. Patient started on nitroglycerin drip with good pain relief. Cardiology consulted and plan was for admission to telemetry.  CRITICAL CARE Performed by: Lawana Chambers   Total critical care time: 35  Critical care time was exclusive of separately billable procedures and treating other patients.  Critical care was necessary to treat or prevent imminent or life-threatening deterioration.  Critical care was time spent personally by me on the following activities: development of treatment plan with patient and/or surrogate as well as nursing, discussions with consultants, evaluation of  patient's response to treatment, examination of patient, obtaining history from patient or surrogate, ordering and performing treatments and interventions, ordering and review of laboratory studies, ordering and review of radiographic studies, pulse oximetry and re-evaluation of patient's condition.  This patient was discussed with and evaluated by Dr. Fayrene Fearing who agrees with assessment and plan.   Everlene Farrier, PA-C 05/07/15 1632  Rolland Porter, MD 05/16/15 [phone]

## 2015-05-08 LAB — TROPONIN I

## 2015-05-08 LAB — CBC
HCT: 42.5 % (ref 39.0–52.0)
Hemoglobin: 14.4 g/dL (ref 13.0–17.0)
MCH: 31.2 pg (ref 26.0–34.0)
MCHC: 33.9 g/dL (ref 30.0–36.0)
MCV: 92 fL (ref 78.0–100.0)
Platelets: 230 10*3/uL (ref 150–400)
RBC: 4.62 MIL/uL (ref 4.22–5.81)
RDW: 14.1 % (ref 11.5–15.5)
WBC: 5.8 10*3/uL (ref 4.0–10.5)

## 2015-05-08 LAB — BASIC METABOLIC PANEL
ANION GAP: 7 (ref 5–15)
BUN: 22 mg/dL — ABNORMAL HIGH (ref 6–20)
CO2: 28 mmol/L (ref 22–32)
Calcium: 9.1 mg/dL (ref 8.9–10.3)
Chloride: 101 mmol/L (ref 101–111)
Creatinine, Ser: 1.51 mg/dL — ABNORMAL HIGH (ref 0.61–1.24)
GFR calc Af Amer: 59 mL/min — ABNORMAL LOW (ref >60)
GFR calc non Af Amer: 51 mL/min — ABNORMAL LOW (ref >60)
GLUCOSE: 117 mg/dL — AB (ref 65–99)
POTASSIUM: 4.4 mmol/L (ref 3.5–5.1)
SODIUM: 136 mmol/L (ref 135–145)

## 2015-05-08 LAB — LIPID PANEL
Cholesterol: 163 mg/dL (ref 0–200)
HDL: 42 mg/dL (ref >40)
LDL CALC: 93 mg/dL (ref 0–99)
Total CHOL/HDL Ratio: 3.9 RATIO
Triglycerides: 140 mg/dL (ref <150)
VLDL: 28 mg/dL (ref 0–40)

## 2015-05-08 LAB — HEMOGLOBIN A1C
Hgb A1c MFr Bld: 6.3 % — ABNORMAL HIGH (ref 4.8–5.6)
Mean Plasma Glucose: 134 mg/dL

## 2015-05-08 MED ORDER — NITROGLYCERIN IN D5W 200-5 MCG/ML-% IV SOLN
10.0000 ug/min | INTRAVENOUS | Status: DC
  Administered 2015-05-08: 10 ug/min via INTRAVENOUS

## 2015-05-08 NOTE — Progress Notes (Signed)
Patient and wife were ambulating in hall and patient began to have sub sternal chest pressure 5 or 6   Out of 10 on pain scale. Patient got very weak and took 2 people to set  him in wheel chair and get back to bed. Applied o2 at 4 l/m n.c. Check V.S. See flow sheet for V.S. Skin warm and dry.Resp even and unlab. But patient complain of some S.O.B. And mild nausea. First NTG s.l. Given chest pressure down to 3/10 But patient c/o heaviness in both arms. E.K.G. Done and shows  S.R. 2nd NTG s.l. Given and patient was pain free with the 2nd one. R.N. Was aware and Dr. Mayford Knife was made aware see orders to start NTG Drip. Will keep Patient on bedrest rest . Cont. To monitor patient.

## 2015-05-08 NOTE — Progress Notes (Signed)
Patient ID: Alejandro Turner, male   DOB: 1961-01-07, 54 y.o.   MRN: [id number]    Patient Name: Alejandro Turner Date of Encounter: 05/08/2015     Active Problems:   HTN (hypertension)   Mixed hyperlipidemia   Borderline diabetes   Chest pain with moderate risk for cardiac etiology    SUBJECTIVE  No sob. Still with mild chest pain.   CURRENT MEDS . aspirin EC  81 mg Oral Daily  . atorvastatin  10 mg Oral Daily  . ezetimibe  10 mg Oral Daily  . heparin  5,000 Units Subcutaneous 3 times per day  . irbesartan  150 mg Oral Daily  . metoprolol tartrate  12.5 mg Oral BID  . omega-3 acid ethyl esters  1 g Oral QPM  . pantoprazole  40 mg Oral QAC breakfast    OBJECTIVE  Filed Vitals:   05/07/15 1736 05/07/15 2031 05/08/15 0621 05/08/15 0945  BP: 125/90 114/71 99/56 102/65  Pulse:  68 60   Temp: 98.3 F (36.8 C) 98.4 F (36.9 C) 97.6 F (36.4 C)   TempSrc: Oral Oral Oral   Resp: 18 18 18    Height:      Weight:      SpO2: 100% 97% 99%     Intake/Output Summary (Last 24 hours) at 05/08/15 1144 Last data filed at 05/08/15 0600  Gross per 24 hour  Intake      0 ml  Output    425 ml  Net   -425 ml   Filed Weights   05/07/15 1011  Weight: 185 lb (83.915 kg)    PHYSICAL EXAM  General: Pleasant, NAD. Neuro: Alert and oriented X 3. Moves all extremities spontaneously. Psych: Normal affect. HEENT:  Normal  Neck: Supple without bruits or JVD. Lungs:  Resp regular and unlabored, CTA. Heart: RRR no s3, s4, or murmurs. Abdomen: Soft, non-tender, non-distended, BS + x 4.  Extremities: No clubbing, cyanosis or edema. DP/PT/Radials 2+ and equal bilaterally.  Accessory Clinical Findings  CBC  Recent Labs  05/07/15 1042 05/08/15 0545  WBC 5.3 5.8  NEUTROABS 3.1  --   HGB 15.6 14.4  HCT 44.8 42.5  MCV 91.4 92.0  PLT 242 230   Basic Metabolic Panel  Recent Labs  05/07/15 1042 05/08/15 0545  NA 136 136  K 4.3 4.4  CL 104 101  CO2 23 28  GLUCOSE 118*  117*  BUN 17 22*  CREATININE 1.12 1.51*  CALCIUM 9.8 9.1   Liver Function Tests  Recent Labs  05/07/15 1042  AST 33  ALT 46  ALKPHOS 60  BILITOT 0.6  PROT 8.1  ALBUMIN 4.3   No results for input(s): LIPASE, AMYLASE in the last 72 hours. Cardiac Enzymes  Recent Labs  05/07/15 1832 05/07/15 2310 05/08/15 0545  TROPONINI <0.03 <0.03 <0.03   BNP Invalid input(s): POCBNP D-Dimer No results for input(s): DDIMER in the last 72 hours. Hemoglobin A1C  Recent Labs  05/07/15 1832  HGBA1C 6.3*   Fasting Lipid Panel  Recent Labs  05/08/15 0545  CHOL 163  HDL 42  LDLCALC 93  TRIG 140  CHOLHDL 3.9   Thyroid Function Tests No results for input(s): TSH, T4TOTAL, T3FREE, THYROIDAB in the last 72 hours.  Invalid input(s): FREET3  TELE  nsr  Radiology/Studies  Dg Chest 2 View  05/07/2015   CLINICAL DATA:  Non radiating chest pain. Onset yesterday. Shortness of breath.  EXAM: CHEST  2 VIEW  COMPARISON:  01/03/2012.  FINDINGS:  The heart size and mediastinal contours are within normal limits. Both lungs are clear. The visualized skeletal structures are unremarkable.  IMPRESSION: No active cardiopulmonary disease.   Electronically Signed   By: Elsie Stain M.D.   On: 05/07/2015 11:44    ASSESSMENT AND PLAN  1. [place] 2.known CAD 3. HTN Rec: The patient was schedule for diagnostic cath in July in [place] because he was coming to [place] to visit family. He has known CAD. He is very worried that he has developed more blockages despite his normal blood work and minimally abnormal ECG. I will wean NTG off. Will have him ambulate in the halls.  Gregg Taylor,M.D.  05/08/2015 11:44 AM

## 2015-05-09 LAB — GLUCOSE, CAPILLARY: Glucose-Capillary: 138 mg/dL — ABNORMAL HIGH (ref 65–99)

## 2015-05-09 LAB — PROTIME-INR
INR: 0.96 (ref 0.00–1.49)
PROTHROMBIN TIME: 13 s (ref 11.6–15.2)

## 2015-05-09 MED ORDER — SODIUM CHLORIDE 0.9 % IV SOLN
INTRAVENOUS | Status: AC
  Administered 2015-05-09: 20:00:00 via INTRAVENOUS

## 2015-05-09 MED ORDER — SODIUM CHLORIDE 0.9 % IJ SOLN: 3.0000 mL | INTRAMUSCULAR | Status: DC | PRN

## 2015-05-09 MED ORDER — ASPIRIN EC 81 MG PO TBEC: 81.0000 mg | DELAYED_RELEASE_TABLET | Freq: Every day | ORAL | Status: DC

## 2015-05-09 MED ORDER — SODIUM CHLORIDE 0.9 % IV SOLN: INTRAVENOUS | Status: DC

## 2015-05-09 MED ORDER — SODIUM CHLORIDE 0.9 % IJ SOLN
3.0000 mL | Freq: Two times a day (BID) | INTRAMUSCULAR | Status: DC
  Administered 2015-05-09 – 2015-05-10 (×2): 3 mL via INTRAVENOUS

## 2015-05-09 MED ORDER — ASPIRIN 81 MG PO CHEW
81.0000 mg | CHEWABLE_TABLET | ORAL | Status: AC
  Administered 2015-05-10: 81 mg via ORAL
  Filled 2015-05-09: qty 1

## 2015-05-09 MED ORDER — SODIUM CHLORIDE 0.9 % IV SOLN: 250.0000 mL | INTRAVENOUS | Status: DC | PRN

## 2015-05-09 NOTE — Progress Notes (Addendum)
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   CE Neg x 3. Serial EKGs nonischemic.   Subjective: No symptoms at rest but still with exertional chest pain and dyspnea. States he would feel more comfortable undergoing a LHC here vs driving back to [place] and waiting until 05/27/15 for scheduled cath in [place].   Objective: Vital signs in last 24 hours: Temp:  [97.8 F (36.6 C)-98.3 F (36.8 C)] 97.9 F (36.6 C) (06/19 0600) Pulse Rate:  [58-73] 58 (06/19 0600) Resp:  [18] 18 (06/19 0600) BP: (97-142)/(59-83) 114/69 mmHg (06/19 0600) SpO2:  [96 %-100 %] 100 % (06/19 0600) Last BM Date: 05/07/15  Intake/Output from previous day: 06/18 0701 - 06/19 0700 In: -  Out: 725 [Urine:725] Intake/Output this shift:    Medications Current Facility-Administered Medications  Medication Dose Route Frequency Provider Last Rate Last Dose  . acetaminophen (TYLENOL) tablet 650 mg  650 mg Oral Q4H PRN Brittainy M Simmons, PA-C      . aspirin EC tablet 81 mg  81 mg Oral Daily Allayne Butcher, PA-C   81 mg at 05/08/15 0951  . atorvastatin (LIPITOR) tablet 10 mg  10 mg Oral Daily Brittainy Sherlynn Carbon, PA-C   10 mg at 05/08/15 0951  . ezetimibe (ZETIA) tablet 10 mg  10 mg Oral Daily Brittainy Sherlynn Carbon, PA-C   10 mg at 05/08/15 0951  . gabapentin (NEURONTIN) capsule 300 mg  300 mg Oral Daily PRN Brittainy Sherlynn Carbon, PA-C      . heparin injection 5,000 Units  5,000 Units Subcutaneous 3 times per day Allayne Butcher, PA-C   5,000 Units at 05/09/15 0551  . irbesartan (AVAPRO) tablet 150 mg  150 mg Oral Daily Brittainy Sherlynn Carbon, PA-C   150 mg at 05/08/15 0951  . metoprolol tartrate (LOPRESSOR) tablet 12.5 mg  12.5 mg Oral BID Allayne Butcher, PA-C   12.5 mg at 05/08/15 2149  . nitroGLYCERIN (NITROSTAT) SL tablet 0.4 mg  0.4 mg Sublingual Q5 min PRN Everlene Farrier, PA-C   0.4 mg at 05/08/15 1946    . nitroGLYCERIN 50 mg in dextrose 5 % 250 mL (0.2 mg/mL) infusion  10 mcg/min Intravenous Continuous Quintella Reichert, MD 3 mL/hr at 05/08/15 2020 10 mcg/min at 05/08/15 2020  . omega-3 acid ethyl esters (LOVAZA) capsule 1 g  1 g Oral QPM Brittainy M Simmons, PA-C   1 g at 05/08/15 1713  . ondansetron (ZOFRAN) injection 4 mg  4 mg Intravenous Q6H PRN Brittainy M Simmons, PA-C      . pantoprazole (PROTONIX) EC tablet 40 mg  40 mg Oral QAC breakfast Brittainy Sherlynn Carbon, PA-C   40 mg at 05/09/15 0551    PE: General appearance: alert, cooperative and no distress Neck: no carotid bruit and no JVD Lungs: clear to auscultation bilaterally Heart: regular rate and rhythm, S1, S2 normal, no murmur, click, rub or gallop Extremities: no LEE Pulses: 2+ and symmetric Skin: warm and dry Neurologic: Grossly normal  Lab Results:   Recent Labs  05/07/15 1042 05/08/15 0545  WBC 5.3 5.8  HGB 15.6 14.4  HCT 44.8 42.5  PLT 242 230   BMET  Recent Labs  05/07/15 1042 05/08/15 0545  NA 136 136  K 4.3 4.4  CL 104 101  CO2 23 28  GLUCOSE 118* 117*  BUN 17 22*  CREATININE 1.12  1.51*  CALCIUM 9.8 9.1   PT/INR No results for input(s): LABPROT, INR in the last 72 hours. Cholesterol  Recent Labs  05/08/15 0545  CHOL 163   Cardiac Panel (last 3 results)  Recent Labs  05/07/15 1832 05/07/15 2310 05/08/15 0545  TROPONINI <0.03 <0.03 <0.03     Assessment/Plan  Active Problems:   HTN (hypertension)   Mixed hyperlipidemia   Borderline diabetes   Chest pain with moderate risk for cardiac etiology    1. Chest Pain/CAD: H/o nonobstructive CAD on LHC 5 years ago in [place]. He continues to note exertional chest pain/ dyspnea but no symptoms at rest. He remains on IV heparin. CE are negative x 3. Serial resting EKGs nonischemic. He is scheduled for a LHC in [place] on 05/27/15. However, patient is concerned about driving back to [facility] and waiting several more weeks. Recommend cath tomorrow for  definitive assessment. Continue ASA, statin, BB and ARB.    2. HTN: BP well controlled on BB and ARB.  3. HLD: LDL ok at 93. Less than 70 more ideal given his CAD. He is only on low dose Lipitor, 10 mg. Consider increasing to 40 mg for better LDL reduction. LFTs are WNL.   4. Acute Renal Insufficiency: Slight bump in Scr from 1.12 to 1.51. Will need pre-cath hydration, if MD decides on cath. Repeat BMP in the am.   LOS: 2 days    Brittainy M. Delmer Islam 05/09/2015 7:42 AM  Cardiology Attending  Patient seen and examined. Will plan on hydrating the patient today and proceeding with left heart cath tomorrow.  Gregg Taylor,M.D. Cardiology Attending

## 2015-05-10 ENCOUNTER — Encounter (HOSPITAL_COMMUNITY): Payer: Self-pay | Admitting: Nurse Practitioner

## 2015-05-10 ENCOUNTER — Encounter (HOSPITAL_COMMUNITY)
Admission: EM | Disposition: A | Discharge status: Home / Self Care | Payer: Managed Care, Other (non HMO) | Source: Home / Self Care | Attending: Cardiovascular Disease

## 2015-05-10 DIAGNOSIS — R079 Chest pain, unspecified: Secondary | ICD-10-CM | POA: Insufficient documentation

## 2015-05-10 HISTORY — PX: CARDIAC CATHETERIZATION: SHX172

## 2015-05-10 LAB — BASIC METABOLIC PANEL
Anion gap: 8 (ref 5–15)
BUN: 16 mg/dL (ref 6–20)
CALCIUM: 8.8 mg/dL — AB (ref 8.9–10.3)
CO2: 22 mmol/L (ref 22–32)
CREATININE: 1.14 mg/dL (ref 0.61–1.24)
Chloride: 104 mmol/L (ref 101–111)
GFR calc Af Amer: >60 mL/min (ref >60)
GLUCOSE: 112 mg/dL — AB (ref 65–99)
Potassium: 5 mmol/L (ref 3.5–5.1)
SODIUM: 134 mmol/L — AB (ref 135–145)

## 2015-05-10 SURGERY — LEFT HEART CATH AND CORONARY ANGIOGRAPHY

## 2015-05-10 MED ORDER — SODIUM CHLORIDE 0.9 % WEIGHT BASED INFUSION: 3.0000 mL/kg/h | INTRAVENOUS | Status: AC

## 2015-05-10 MED ORDER — FENTANYL CITRATE (PF) 100 MCG/2ML IJ SOLN
INTRAMUSCULAR | Status: DC | PRN
  Administered 2015-05-10: 25 ug via INTRAVENOUS

## 2015-05-10 MED ORDER — HEPARIN SODIUM (PORCINE) 1000 UNIT/ML IJ SOLN
INTRAMUSCULAR | Status: DC | PRN
  Administered 2015-05-10: 4000 [IU] via INTRAVENOUS

## 2015-05-10 MED ORDER — ACETAMINOPHEN 325 MG PO TABS: 650.0000 mg | ORAL_TABLET | ORAL | Status: DC | PRN

## 2015-05-10 MED ORDER — ASPIRIN 81 MG PO CHEW: 81.0000 mg | CHEWABLE_TABLET | Freq: Every day | ORAL | Status: DC

## 2015-05-10 MED ORDER — MORPHINE SULFATE 2 MG/ML IJ SOLN: 2.0000 mg | INTRAMUSCULAR | Status: DC | PRN

## 2015-05-10 MED ORDER — MIDAZOLAM HCL 2 MG/2ML IJ SOLN
INTRAMUSCULAR | Status: DC | PRN
  Administered 2015-05-10: 1 mg via INTRAVENOUS

## 2015-05-10 MED ORDER — LIDOCAINE HCL (PF) 1 % IJ SOLN
INTRAMUSCULAR | Status: AC
  Filled 2015-05-10: qty 30

## 2015-05-10 MED ORDER — FENTANYL CITRATE (PF) 100 MCG/2ML IJ SOLN
INTRAMUSCULAR | Status: AC
  Filled 2015-05-10: qty 2

## 2015-05-10 MED ORDER — VERAPAMIL HCL 2.5 MG/ML IV SOLN
INTRAVENOUS | Status: AC
  Filled 2015-05-10: qty 2

## 2015-05-10 MED ORDER — METHYLPREDNISOLONE SODIUM SUCC 125 MG IJ SOLR
INTRAMUSCULAR | Status: AC
  Filled 2015-05-10: qty 2

## 2015-05-10 MED ORDER — MIDAZOLAM HCL 2 MG/2ML IJ SOLN
INTRAMUSCULAR | Status: AC
  Filled 2015-05-10: qty 2

## 2015-05-10 MED ORDER — SODIUM CHLORIDE 0.9 % IJ SOLN: 3.0000 mL | INTRAMUSCULAR | Status: DC | PRN

## 2015-05-10 MED ORDER — SODIUM CHLORIDE 0.9 % IJ SOLN: 3.0000 mL | Freq: Two times a day (BID) | INTRAMUSCULAR | Status: DC

## 2015-05-10 MED ORDER — DIPHENHYDRAMINE HCL 25 MG PO CAPS
25.0000 mg | ORAL_CAPSULE | Freq: Once | ORAL | Status: AC
  Administered 2015-05-10: 25 mg via ORAL
  Filled 2015-05-10: qty 1

## 2015-05-10 MED ORDER — METHYLPREDNISOLONE SODIUM SUCC 125 MG IJ SOLR
INTRAMUSCULAR | Status: DC | PRN
  Administered 2015-05-10: 60 mg via INTRAVENOUS

## 2015-05-10 MED ORDER — VERAPAMIL HCL 2.5 MG/ML IV SOLN
INTRAVENOUS | Status: DC | PRN
  Administered 2015-05-10: 11:00:00 via INTRA_ARTERIAL

## 2015-05-10 MED ORDER — NITROGLYCERIN 1 MG/10 ML FOR IR/CATH LAB
INTRA_ARTERIAL | Status: AC
  Filled 2015-05-10: qty 10

## 2015-05-10 MED ORDER — HEPARIN (PORCINE) IN NACL 2-0.9 UNIT/ML-% IJ SOLN
INTRAMUSCULAR | Status: AC
  Filled 2015-05-10: qty 1000

## 2015-05-10 MED ORDER — SODIUM CHLORIDE 0.9 % IV SOLN: 250.0000 mL | INTRAVENOUS | Status: DC | PRN

## 2015-05-10 MED ORDER — HEPARIN SODIUM (PORCINE) 1000 UNIT/ML IJ SOLN
INTRAMUSCULAR | Status: AC
  Filled 2015-05-10: qty 1

## 2015-05-10 MED ORDER — FAMOTIDINE IN NACL 20-0.9 MG/50ML-% IV SOLN
INTRAVENOUS | Status: DC | PRN
  Administered 2015-05-10: 20 mg via INTRAVENOUS

## 2015-05-10 MED ORDER — ONDANSETRON HCL 4 MG/2ML IJ SOLN: 4.0000 mg | Freq: Four times a day (QID) | INTRAMUSCULAR | Status: DC | PRN

## 2015-05-10 MED ORDER — IOHEXOL 350 MG/ML SOLN
INTRAVENOUS | Status: DC | PRN
  Administered 2015-05-10: 80 mL via INTRA_ARTERIAL

## 2015-05-10 MED ORDER — PREDNISONE 50 MG PO TABS
50.0000 mg | ORAL_TABLET | Freq: Four times a day (QID) | ORAL | Status: DC
  Administered 2015-05-10 (×2): 50 mg via ORAL
  Filled 2015-05-10 (×3): qty 1

## 2015-05-10 SURGICAL SUPPLY — 16 items
CATH INFINITI 5FR AL1 (CATHETERS) ×1 IMPLANT
CATH INFINITI 5FR ANG PIGTAIL (CATHETERS) ×2 IMPLANT
CATH INFINITI 5FR MULTPACK ANG (CATHETERS) IMPLANT
CATH INFINITI JR4 5F (CATHETERS) ×1 IMPLANT
CATH OPTITORQUE TIG 4.0 5F (CATHETERS) ×2 IMPLANT
DEVICE RAD COMP TR BAND LRG (VASCULAR PRODUCTS) ×2 IMPLANT
GLIDESHEATH SLEND A-KIT 6F 22G (SHEATH) ×2 IMPLANT
KIT HEART LEFT (KITS) ×2 IMPLANT
PACK CARDIAC CATHETERIZATION (CUSTOM PROCEDURE TRAY) ×2 IMPLANT
SHEATH PINNACLE 5F 10CM (SHEATH) IMPLANT
SYR MEDRAD MARK V 150ML (SYRINGE) ×2 IMPLANT
TRANSDUCER W/STOPCOCK (MISCELLANEOUS) ×2 IMPLANT
TUBING CIL FLEX 10 FLL-RA (TUBING) ×2 IMPLANT
WIRE EMERALD 3MM-J .035X150CM (WIRE) IMPLANT
WIRE HI TORQ VERSACORE-J 145CM (WIRE) ×2 IMPLANT
WIRE SAFE-T 1.5MM-J .035X260CM (WIRE) ×2 IMPLANT

## 2015-05-10 NOTE — Discharge Summary (Signed)
Discharge Summary   Patient ID: Alejandro Turner,  MRN: [id number], DOB/AGE: 55-Mar-1962 55 y.o.  Admit date: 05/07/2015 Discharge date: 05/10/2015  Primary Care Provider: [facility] [place] Primary Cardiologist: Follow[facility] [place]  Discharge Diagnoses Principal Problem:   Chest pain with moderate risk for cardiac etiology  **s/p catheterization this admission revealing normal coronary arteries.  Active Problems:   HTN (hypertension)   Borderline diabetes   Mixed hyperlipidemia   Allergies Allergies  Allergen Reactions  . Contrast Media [Iodinated Diagnostic Agents] Rash  . Food Rash    Soy beans  . Shrimp [Shellfish Allergy] Hives  . Tree Extract Other (See Comments)    Intolerance to all plants, trees and grass   Procedures  Cardiac Catheterization 6.20.2016  Normal coronary arteries. The left ventricular size is normal. The left ventricular systolic function is normal. The left ventricular ejection fraction is 55-65% by visual estimate. _____________   History of Present Illness  54 y/o male with a prior h/o atypical chest pain, nonobstructive CAD, HTN, HL, and borderline diabetes mellitus.  He lives in [place] and has recently been seeing a cardiologist there for chest pain with a plan for catheterization tentatively scheduled for 05/27/2015.  He is in [place] visiting family and presented to the [facility] ED on 6/17 with complaints of left sided chest pressure radiating to the left arm.  There, ECG was non-acute and troponin was normal.  He was admitted for further evaluation.    Hospital Course  Patient ruled out for MI.  He continued to c/o mild chest pain over the weekend, especially with ambulation.  Decision was made to pursue diagnostic catheterization.  He does have a contrast allergy and was appropriately pre-medicated.  He underwent diagnostic cath this AM revealing normal coronary arteries and normal LV function.  He will be discharged home today in good condition  with recommendation to f/u with medical team in [place] upon return.  Discharge Vitals Blood pressure 115/70, pulse 77, temperature 98 F (36.7 C), temperature source Oral, resp. rate 11, height 5\' 5"  (1.651 m), weight 189 lb 1.6 oz (85.775 kg), SpO2 99 %.  Filed Weights   05/07/15 1011 05/10/15 0627  Weight: 185 lb (83.915 kg) 189 lb 1.6 oz (85.775 kg)    Labs  CBC  Recent Labs  05/08/15 0545  WBC 5.8  HGB 14.4  HCT 42.5  MCV 92.0  PLT 230   Basic Metabolic Panel  Recent Labs  05/08/15 0545 05/10/15 0714  NA 136 134*  K 4.4 5.0  CL 101 104  CO2 28 22  GLUCOSE 117* 112*  BUN 22* 16  CREATININE 1.51* 1.14  CALCIUM 9.1 8.8*   Cardiac Enzymes  Recent Labs  05/07/15 1832 05/07/15 2310 05/08/15 0545  TROPONINI <0.03 <0.03 <0.03   Hemoglobin A1C  Recent Labs  05/07/15 1832  HGBA1C 6.3*   Fasting Lipid Panel  Recent Labs  05/08/15 0545  CHOL 163  HDL 42  LDLCALC 93  TRIG 140  CHOLHDL 3.9   Disposition  Pt is being discharged home today in good condition.  Follow-up Plans & Appointments      Follow-up Information    Follow up with Follow-up with your Doctors in [place].   Why:  1-2 wks.     Discharge Medications    Medication List    TAKE these medications        aspirin EC 81 MG tablet  Take 81 mg by mouth daily.     atorvastatin 10 MG  tablet  Commonly known as:  LIPITOR  Take 10 mg by mouth daily.     ezetimibe 10 MG tablet  Commonly known as:  ZETIA  Take 10 mg by mouth daily.     gabapentin 300 MG capsule  Commonly known as:  NEURONTIN  Take 300 mg by mouth daily as needed (pain).     naproxen sodium 220 MG tablet  Commonly known as:  ANAPROX  Take 220 mg by mouth 2 (two) times daily as needed (pain).     olmesartan 20 MG tablet  Commonly known as:  BENICAR  Take 10 mg by mouth daily.     omega-3 acid ethyl esters 1 G capsule  Commonly known as:  LOVAZA  Take 1 g by mouth every evening.     omeprazole 40 MG capsule    Commonly known as:  PRILOSEC  Take 40 mg by mouth daily.     Vitamin D 2000 UNITS tablet  Take 2,000 Units by mouth daily.       Outstanding Labs/Studies  None  Duration of Discharge Encounter   Greater than 30 minutes including physician time.  SignedNicolasa Ducking NP 05/10/2015, 12:07 PM

## 2015-05-10 NOTE — Progress Notes (Signed)
Re-inflated TR band with 3 ml of air because the patient started bleeding at the right radial cath site. Will attempt to deflate 3 ml of air from the TR band again in 30 minutes.  I will monitor vitals every 15 minutes until then.

## 2015-05-10 NOTE — Progress Notes (Signed)
RN notified me of Mr. Ratterree plan for LHC with contrast allergy. I've written for 50 mg q6h prednisone and some benadryl. Based on timing of procedure, the cath lab may want to give IV methylprednisolone dose at time of cath.   Leeann Must, MD

## 2015-05-10 NOTE — H&P (View-Only) (Signed)
Patient Name: Cavan Bearden Date of Encounter: 05/10/2015     Active Problems:   Chest pain with moderate risk for cardiac etiology   HTN (hypertension)   Borderline diabetes   Mixed hyperlipidemia    SUBJECTIVE  Two episodes of brief, mild to moderate chest pressure overnight. Also reports PND. Feeling fine at the moment. Anticipating cath today.    CURRENT MEDS . [START ON 05/11/2015] aspirin EC  81 mg Oral Daily  . atorvastatin  10 mg Oral Daily  . diphenhydrAMINE  25 mg Oral Once  . ezetimibe  10 mg Oral Daily  . heparin  5,000 Units Subcutaneous 3 times per day  . irbesartan  150 mg Oral Daily  . metoprolol tartrate  12.5 mg Oral BID  . omega-3 acid ethyl esters  1 g Oral QPM  . pantoprazole  40 mg Oral QAC breakfast  . predniSONE  50 mg Oral Q6H  . sodium chloride  3 mL Intravenous Q12H    OBJECTIVE  Filed Vitals:   05/09/15 1749 05/09/15 1751 05/09/15 2026 05/10/15 0627  BP: 116/67 115/71 109/67 109/69  Pulse: 66  63 59  Temp:   97.9 F (36.6 C) 98 F (36.7 C)  TempSrc:   Oral Oral  Resp:   18 18  Height:      Weight:    189 lb 1.6 oz (85.775 kg)  SpO2:   97% 100%    Intake/Output Summary (Last 24 hours) at 05/10/15 0754 Last data filed at 05/10/15 0600  Gross per 24 hour  Intake 1602.5 ml  Output   1050 ml  Net  552.5 ml   Filed Weights   05/07/15 1011 05/10/15 0627  Weight: 185 lb (83.915 kg) 189 lb 1.6 oz (85.775 kg)    PHYSICAL EXAM  General: Pleasant, NAD. Neuro: Alert and oriented X 3. Moves all extremities spontaneously. Psych: Normal affect. HEENT:  Normal  Neck: Supple without bruits or JVD. Lungs:  Resp regular and unlabored, CTA. Good air movement.  Heart: RRR no s3, s4, or murmurs. Abdomen: Soft, non-tender, non-distended, BS + x 4.  Extremities: No clubbing, cyanosis or edema. DP/PT/Radials 2+ and equal bilaterally.  Accessory Clinical Findings  CBC  Recent Labs  05/07/15 1042 05/08/15 0545  WBC 5.3 5.8  NEUTROABS  3.1  --   HGB 15.6 14.4  HCT 44.8 42.5  MCV 91.4 92.0  PLT 242 230   Basic Metabolic Panel  Recent Labs  05/08/15 0545 05/10/15 0714  NA 136 134*  K 4.4 5.0  CL 101 104  CO2 28 22  GLUCOSE 117* 112*  BUN 22* 16  CREATININE 1.51* 1.14  CALCIUM 9.1 8.8*   Liver Function Tests  Recent Labs  05/07/15 1042  AST 33  ALT 46  ALKPHOS 60  BILITOT 0.6  PROT 8.1  ALBUMIN 4.3   Cardiac Enzymes  Recent Labs  05/07/15 1832 05/07/15 2310 05/08/15 0545  TROPONINI <0.03 <0.03 <0.03   Hemoglobin A1C  Recent Labs  05/07/15 1832  HGBA1C 6.3*   Fasting Lipid Panel  Recent Labs  05/08/15 0545  CHOL 163  HDL 42  LDLCALC 93  TRIG 140  CHOLHDL 3.9   TELE  SR, rate 73  ECG  SB, rate 55, ST depression in lead III  Radiology/Studies  Dg Chest 2 View  05/07/2015   CLINICAL DATA:  Non radiating chest pain. Onset yesterday. Shortness of breath.  EXAM: CHEST  2 VIEW  COMPARISON:  01/03/2012.  FINDINGS: The  heart size and mediastinal contours are within normal limits. Both lungs are clear. The visualized skeletal structures are unremarkable.  IMPRESSION: No active cardiopulmonary disease.   Electronically Signed   By: Elsie Stain M.D.   On: 05/07/2015 11:44    ASSESSMENT AND PLAN  Patient Active Problem List   Diagnosis Date Noted  . Chest pain with moderate risk for cardiac etiology 05/07/2015  . Hyponatremia 01/02/2012  . Fatigue 12/20/2011  . Borderline diabetes 12/19/2011  . Chest pain 12/17/2011  . HTN (hypertension) 12/17/2011  . Mixed hyperlipidemia 12/17/2011  . Left shoulder pain 12/17/2011    1. Chest pain/CAD: - Cardiac enzymes negative x3. ECG with no clear ischemia.  - LHC planned for today for definitive evaluation.  - Premedicated with prednisone for contrast allergy - Continue NTG drip at 10 mcg/min - Continue ASA 81 mg daily, Lipitor 10 mg daily, Zetia 10 mg daily, and Lopressor 12.5 mg BID. Consider increasing Lipitor to high-intensity  dose.  - Hold Avapro this morning (K+ 5.0).   2. Hyponatremia: - Na+ 134 today - Reassess BMET tomorrow  3. Borderline diabetes: - Counseled about lifestyle modifications including low glycemic index diet and daily physical activity.  - F/u HgbA1c with PCP in 3 months.   4. HTN: - Well controlled during this hospitalization - Continue Lopressor and restart Avapro tomorrow if not hyperkalemic.   5. HLD: - On Lipitor and Zetia.  - Consider increasing Lipitor to 80 mg if there is evidence of obstructive CAD.    Signed, Nicolasa Ducking NP   I have personally seen and examined the patient independently and agree with the note as outlined by Franne Forts Student -NP and Ward Givens, NP.  Patient had 2 episodes of chest pain last night but currently pain free on IV NTG.  Continue ASA/statin/BB.  Agree with holding ARB this am for cath.  HbA1C elevated and will need to be followed up with PCP.   Plan cath later today.    Signed: Armanda Magic, MD [facility] 05/10/2015

## 2015-05-10 NOTE — Progress Notes (Signed)
 Patient Name: Alejandro Turner Date of Encounter: 05/10/2015     Active Problems:   Chest pain with moderate risk for cardiac etiology   HTN (hypertension)   Borderline diabetes   Mixed hyperlipidemia    SUBJECTIVE  Two episodes of brief, mild to moderate chest pressure overnight. Also reports PND. Feeling fine at the moment. Anticipating cath today.    CURRENT MEDS . [START ON 05/11/2015] aspirin EC  81 mg Oral Daily  . atorvastatin  10 mg Oral Daily  . diphenhydrAMINE  25 mg Oral Once  . ezetimibe  10 mg Oral Daily  . heparin  5,000 Units Subcutaneous 3 times per day  . irbesartan  150 mg Oral Daily  . metoprolol tartrate  12.5 mg Oral BID  . omega-3 acid ethyl esters  1 g Oral QPM  . pantoprazole  40 mg Oral QAC breakfast  . predniSONE  50 mg Oral Q6H  . sodium chloride  3 mL Intravenous Q12H    OBJECTIVE  Filed Vitals:   05/09/15 1749 05/09/15 1751 05/09/15 2026 05/10/15 0627  BP: 116/67 115/71 109/67 109/69  Pulse: 66  63 59  Temp:   97.9 F (36.6 C) 98 F (36.7 C)  TempSrc:   Oral Oral  Resp:   18 18  Height:      Weight:    189 lb 1.6 oz (85.775 kg)  SpO2:   97% 100%    Intake/Output Summary (Last 24 hours) at 05/10/15 0754 Last data filed at 05/10/15 0600  Gross per 24 hour  Intake 1602.5 ml  Output   1050 ml  Net  552.5 ml   Filed Weights   05/07/15 1011 05/10/15 0627  Weight: 185 lb (83.915 kg) 189 lb 1.6 oz (85.775 kg)    PHYSICAL EXAM  General: Pleasant, NAD. Neuro: Alert and oriented X 3. Moves all extremities spontaneously. Psych: Normal affect. HEENT:  Normal  Neck: Supple without bruits or JVD. Lungs:  Resp regular and unlabored, CTA. Good air movement.  Heart: RRR no s3, s4, or murmurs. Abdomen: Soft, non-tender, non-distended, BS + x 4.  Extremities: No clubbing, cyanosis or edema. DP/PT/Radials 2+ and equal bilaterally.  Accessory Clinical Findings  CBC  Recent Labs  05/07/15 1042 05/08/15 0545  WBC 5.3 5.8  NEUTROABS  3.1  --   HGB 15.6 14.4  HCT 44.8 42.5  MCV 91.4 92.0  PLT 242 230   Basic Metabolic Panel  Recent Labs  05/08/15 0545 05/10/15 0714  NA 136 134*  K 4.4 5.0  CL 101 104  CO2 28 22  GLUCOSE 117* 112*  BUN 22* 16  CREATININE 1.51* 1.14  CALCIUM 9.1 8.8*   Liver Function Tests  Recent Labs  05/07/15 1042  AST 33  ALT 46  ALKPHOS 60  BILITOT 0.6  PROT 8.1  ALBUMIN 4.3   Cardiac Enzymes  Recent Labs  05/07/15 1832 05/07/15 2310 05/08/15 0545  TROPONINI <0.03 <0.03 <0.03   Hemoglobin A1C  Recent Labs  05/07/15 1832  HGBA1C 6.3*   Fasting Lipid Panel  Recent Labs  05/08/15 0545  CHOL 163  HDL 42  LDLCALC 93  TRIG 140  CHOLHDL 3.9   TELE  SR, rate 73  ECG  SB, rate 55, ST depression in lead III  Radiology/Studies  Dg Chest 2 View  05/07/2015   CLINICAL DATA:  Non radiating chest pain. Onset yesterday. Shortness of breath.  EXAM: CHEST  2 VIEW  COMPARISON:  01/03/2012.  FINDINGS: The   heart size and mediastinal contours are within normal limits. Both lungs are clear. The visualized skeletal structures are unremarkable.  IMPRESSION: No active cardiopulmonary disease.   Electronically Signed   By: John T Curnes M.D.   On: 05/07/2015 11:44    ASSESSMENT AND PLAN  Patient Active Problem List   Diagnosis Date Noted  . Chest pain with moderate risk for cardiac etiology 05/07/2015  . Hyponatremia 01/02/2012  . Fatigue 12/20/2011  . Borderline diabetes 12/19/2011  . Chest pain 12/17/2011  . HTN (hypertension) 12/17/2011  . Mixed hyperlipidemia 12/17/2011  . Left shoulder pain 12/17/2011    1. Chest pain/CAD: - Cardiac enzymes negative x3. ECG with no clear ischemia.  - LHC planned for today for definitive evaluation.  - Premedicated with prednisone for contrast allergy - Continue NTG drip at 10 mcg/min - Continue ASA 81 mg daily, Lipitor 10 mg daily, Zetia 10 mg daily, and Lopressor 12.5 mg BID. Consider increasing Lipitor to high-intensity  dose.  - Hold Avapro this morning (K+ 5.0).   2. Hyponatremia: - Na+ 134 today - Reassess BMET tomorrow  3. Borderline diabetes: - Counseled about lifestyle modifications including low glycemic index diet and daily physical activity.  - F/u HgbA1c with PCP in 3 months.   4. HTN: - Well controlled during this hospitalization - Continue Lopressor and restart Avapro tomorrow if not hyperkalemic.   5. HLD: - On Lipitor and Zetia.  - Consider increasing Lipitor to 80 mg if there is evidence of obstructive CAD.    Signed, Christopher Berge NP   I have personally seen and examined the patient independently and agree with the note as outlined by Tyler Crane Student -NP and Chris Berge, NP.  Patient had 2 episodes of chest pain last night but currently pain free on IV NTG.  Continue ASA/statin/BB.  Agree with holding ARB this am for cath.  HbA1C elevated and will need to be followed up with PCP.   Plan cath later today.    Signed: Dollie Mayse, MD [facility] 05/10/2015 

## 2015-05-10 NOTE — Progress Notes (Signed)
Utilization review completed.  

## 2015-05-10 NOTE — Interval H&P Note (Signed)
Cath Lab Visit (complete for each Cath Lab visit)  Clinical Evaluation Leading to the Procedure:   ACS: Yes.    Non-ACS:    Anginal Classification: CCS IV  Anti-ischemic medical therapy: No Therapy  Non-Invasive Test Results: No non-invasive testing performed  Prior CABG: No previous CABG      History and Physical Interval Note:  05/10/2015 10:31 AM  Alejandro Turner  has presented today for surgery, with the diagnosis of unstable angina  The various methods of treatment have been discussed with the patient and family. After consideration of risks, benefits and other options for treatment, the patient has consented to  Procedure(s): Left Heart Cath and Coronary Angiography (N/A) as a surgical intervention .  The patient's history has been reviewed, patient examined, no change in status, stable for surgery.  I have reviewed the patient's chart and labs.  Questions were answered to the patient's satisfaction.     Nanetta Batty

## 2015-05-10 NOTE — Progress Notes (Signed)
Pt ambulated around nurses station 500 feet with no distress or no SOB.  Will walk patient one more time then prepare to D/C him home.

## 2015-05-11 ENCOUNTER — Telehealth: Payer: Self-pay | Admitting: Cardiovascular Disease

## 2015-05-11 MED FILL — Nitroglycerin IV Soln 100 MCG/ML in D5W: INTRA_ARTERIAL | Qty: 10 | Status: AC

## 2015-05-11 MED FILL — Heparin Sodium (Porcine) 2 Unit/ML in Sodium Chloride 0.9%: INTRAMUSCULAR | Qty: 1000 | Status: AC

## 2015-05-11 MED FILL — Lidocaine HCl Local Preservative Free (PF) Inj 1%: INTRAMUSCULAR | Qty: 30 | Status: AC

## 2015-05-11 NOTE — Telephone Encounter (Signed)
Needs a D/C phone call .Marland Kitchen Will f/u with Doctors in [place] .Marland Kitchen Thanks

## 2015-05-13 ENCOUNTER — Encounter (HOSPITAL_COMMUNITY): Payer: Self-pay | Admitting: Emergency Medicine

## 2015-05-13 ENCOUNTER — Emergency Department (HOSPITAL_COMMUNITY): Payer: Managed Care, Other (non HMO)

## 2015-05-13 ENCOUNTER — Emergency Department (HOSPITAL_COMMUNITY)
Admission: EM | Admit: 2015-05-13 | Discharge: 2015-05-14 | Disposition: A | Discharge status: Home / Self Care | Payer: Managed Care, Other (non HMO) | Attending: Emergency Medicine | Admitting: Emergency Medicine

## 2015-05-13 DIAGNOSIS — R079 Chest pain, unspecified: Secondary | ICD-10-CM | POA: Diagnosis present

## 2015-05-13 DIAGNOSIS — E780 Pure hypercholesterolemia: Secondary | ICD-10-CM | POA: Insufficient documentation

## 2015-05-13 DIAGNOSIS — K219 Gastro-esophageal reflux disease without esophagitis: Secondary | ICD-10-CM | POA: Insufficient documentation

## 2015-05-13 DIAGNOSIS — J029 Acute pharyngitis, unspecified: Secondary | ICD-10-CM | POA: Insufficient documentation

## 2015-05-13 DIAGNOSIS — Z87442 Personal history of urinary calculi: Secondary | ICD-10-CM | POA: Insufficient documentation

## 2015-05-13 DIAGNOSIS — R091 Pleurisy: Secondary | ICD-10-CM | POA: Diagnosis not present

## 2015-05-13 DIAGNOSIS — Z79899 Other long term (current) drug therapy: Secondary | ICD-10-CM | POA: Insufficient documentation

## 2015-05-13 DIAGNOSIS — R0602 Shortness of breath: Secondary | ICD-10-CM | POA: Diagnosis not present

## 2015-05-13 DIAGNOSIS — I10 Essential (primary) hypertension: Secondary | ICD-10-CM | POA: Insufficient documentation

## 2015-05-13 DIAGNOSIS — Z9889 Other specified postprocedural states: Secondary | ICD-10-CM | POA: Insufficient documentation

## 2015-05-13 DIAGNOSIS — Z7982 Long term (current) use of aspirin: Secondary | ICD-10-CM | POA: Insufficient documentation

## 2015-05-13 LAB — CBC
HCT: 39.5 % (ref 39.0–52.0)
Hemoglobin: 13.7 g/dL (ref 13.0–17.0)
MCH: 31.4 pg (ref 26.0–34.0)
MCHC: 34.7 g/dL (ref 30.0–36.0)
MCV: 90.6 fL (ref 78.0–100.0)
Platelets: 224 10*3/uL (ref 150–400)
RBC: 4.36 MIL/uL (ref 4.22–5.81)
RDW: 13.7 % (ref 11.5–15.5)
WBC: 11.1 10*3/uL — AB (ref 4.0–10.5)

## 2015-05-13 LAB — HEPATIC FUNCTION PANEL
ALT: 32 U/L (ref 17–63)
AST: 26 U/L (ref 15–41)
Albumin: 3.8 g/dL (ref 3.5–5.0)
Alkaline Phosphatase: 53 U/L (ref 38–126)
Bilirubin, Direct: <0.1 mg/dL — ABNORMAL LOW (ref 0.1–0.5)
TOTAL PROTEIN: 7 g/dL (ref 6.5–8.1)
Total Bilirubin: 0.4 mg/dL (ref 0.3–1.2)

## 2015-05-13 LAB — I-STAT TROPONIN, ED: Troponin i, poc: 0 ng/mL (ref 0.00–0.08)

## 2015-05-13 LAB — BASIC METABOLIC PANEL
Anion gap: 7 (ref 5–15)
BUN: 15 mg/dL (ref 6–20)
CALCIUM: 9 mg/dL (ref 8.9–10.3)
CO2: 23 mmol/L (ref 22–32)
CREATININE: 1.22 mg/dL (ref 0.61–1.24)
Chloride: 105 mmol/L (ref 101–111)
GFR calc non Af Amer: >60 mL/min (ref >60)
GLUCOSE: 153 mg/dL — AB (ref 65–99)
Potassium: 4.1 mmol/L (ref 3.5–5.1)
Sodium: 135 mmol/L (ref 135–145)

## 2015-05-13 LAB — BRAIN NATRIURETIC PEPTIDE: B NATRIURETIC PEPTIDE 5: 10.4 pg/mL (ref 0.0–100.0)

## 2015-05-13 LAB — D-DIMER, QUANTITATIVE (NOT AT ARMC)

## 2015-05-13 LAB — LIPASE, BLOOD: Lipase: 28 U/L (ref 22–51)

## 2015-05-13 MED ORDER — METHOCARBAMOL 500 MG PO TABS: 500.0000 mg | ORAL_TABLET | Freq: Two times a day (BID) | ORAL | Status: DC

## 2015-05-13 MED ORDER — NAPROXEN 500 MG PO TABS
500.0000 mg | ORAL_TABLET | Freq: Two times a day (BID) | ORAL | Status: DC
Start: 2015-05-13 — End: 2016-11-13

## 2015-05-13 MED ORDER — DIAZEPAM 5 MG/ML IJ SOLN
5.0000 mg | Freq: Once | INTRAMUSCULAR | Status: AC
  Administered 2015-05-13: 5 mg via INTRAVENOUS
  Filled 2015-05-13: qty 2

## 2015-05-13 MED ORDER — KETOROLAC TROMETHAMINE 30 MG/ML IJ SOLN
30.0000 mg | Freq: Once | INTRAMUSCULAR | Status: AC
  Administered 2015-05-13: 30 mg via INTRAVENOUS
  Filled 2015-05-13: qty 1

## 2015-05-13 MED ORDER — MECLIZINE HCL 50 MG PO TABS: 50.0000 mg | ORAL_TABLET | Freq: Two times a day (BID) | ORAL | Status: DC | PRN

## 2015-05-13 NOTE — ED Provider Notes (Signed)
CSN: [id number]     Arrival date & time 05/13/15  1945 History   First MD Initiated Contact with Patient 05/13/15 2027     Chief Complaint  Patient presents with  . Chest Pain     (Consider location/radiation/quality/duration/timing/severity/associated sxs/prior Treatment) HPI Comments: Patient is a 54 year old male with a past medical history of hypertension, hypercholesterolemia, borderline diabetes, GERD, and kidney stones who presents with chest pain for the past 3 days. Symptoms started gradually and remained constant since the onset. He describes the pain as a severe left anterior chest and left upper back pressure. Pain is worse with deep inspiration and exertion. No alleviating factors. Patient also reports profuse sweating. No other associated symptoms. Patient was discharged from the hospital 3 days ago for chest pain work up which involved a cardiac echocardiogram and cardiac catheterization. Both of these tests were "clean" and unremarkable for acute changes. EF 55-65%. Patient reports recently driving to [place] from [place] and was also recently hospitalized. He denies previous DVT/PE, exogenous estrogen, recent surgery. He denies leg swelling.   Past Medical History  Diagnosis Date  . Hypertension   . High cholesterol   . Borderline diabetes   . GERD (gastroesophageal reflux disease)   . Allergy   . Chest pain     a. 2013 nl Stress Echo;  b. 04/2015 Cath: nl cors, nl LV fxn.  Marland Kitchen History of stomach ulcers   . Daily headache   . Kidney stones    Past Surgical History  Procedure Laterality Date  . Back surgery    . Shoulder arthroscopy w/ rotator cuff repair Left 1992  . Nose surgery  2005  . Cardiac catheterization  03/2010  . Lumbar disc surgery  2005  . Uvulopalatopharyngoplasty  ~ 2009  . Cardiac catheterization N/A 05/10/2015    Procedure: Left Heart Cath and Coronary Angiography;  Surgeon: Runell Gess, MD;  Location: [facility] INVASIVE CV LAB;  Service: Cardiovascular;   Laterality: N/A;   Family History  Problem Relation Age of Onset  . Kidney disease Mother   . Hypertension Mother   . Heart disease Mother   . Hyperlipidemia Mother   . Cancer Mother     Ovarian Cancer, Breast Cancer  . Diabetes Father   . Hypertension Father   . Heart disease Father   . Hyperlipidemia Father   . Heart disease Sister   . Hyperlipidemia Sister   . Kidney disease Brother    History  Substance Use Topics  . Smoking status: Never Smoker   . Smokeless tobacco: Never Used  . Alcohol Use: No    Review of Systems  HENT: Positive for sore throat.   Respiratory: Positive for shortness of breath.   Cardiovascular: Positive for chest pain.  All other systems reviewed and are negative.     Allergies  Contrast media; Food; Shrimp; and Tree extract  Home Medications   Prior to Admission medications   Medication Sig Start Date End Date Taking? Authorizing Provider  aspirin EC 81 MG tablet Take 81 mg by mouth daily.    Historical Provider, MD  atorvastatin (LIPITOR) 10 MG tablet Take 10 mg by mouth daily.    Historical Provider, MD  Cholecalciferol (VITAMIN D) 2000 UNITS tablet Take 2,000 Units by mouth daily.    Historical Provider, MD  ezetimibe (ZETIA) 10 MG tablet Take 10 mg by mouth daily.    Historical Provider, MD  gabapentin (NEURONTIN) 300 MG capsule Take 300 mg by mouth daily  as needed (pain).    Historical Provider, MD  naproxen sodium (ANAPROX) 220 MG tablet Take 220 mg by mouth 2 (two) times daily as needed (pain).    Historical Provider, MD  olmesartan (BENICAR) 20 MG tablet Take 10 mg by mouth daily.     Historical Provider, MD  omega-3 acid ethyl esters (LOVAZA) 1 G capsule Take 1 g by mouth every evening.    Historical Provider, MD  omeprazole (PRILOSEC) 40 MG capsule Take 40 mg by mouth daily.    Historical Provider, MD   BP 124/71 mmHg  Pulse 69  Temp(Src) 98.3 F (36.8 C) (Oral)  Resp 22  SpO2 99% Physical Exam  Constitutional: He is  oriented to person, place, and time. He appears well-developed and well-nourished. No distress.  HENT:  Head: Normocephalic and atraumatic.  Eyes: Conjunctivae and EOM are normal.  Neck: Normal range of motion.  Cardiovascular: Normal rate and regular rhythm.  Exam reveals no gallop and no friction rub.   No murmur heard. No lower extremity edema or calf tenderness to palpation.   Pulmonary/Chest: Effort normal and breath sounds normal. He has no wheezes. He has no rales. He exhibits no tenderness.  Abdominal: Soft. He exhibits no distension. There is no tenderness. There is no rebound.  Musculoskeletal: Normal range of motion.  Neurological: He is alert and oriented to person, place, and time. Coordination normal.  Speech is goal-oriented. Moves limbs without ataxia.   Skin: Skin is warm and dry.  Psychiatric: He has a normal mood and affect. His behavior is normal.  Nursing note and vitals reviewed.   ED Course  Procedures (including critical care time) Labs Review Labs Reviewed  CBC - Abnormal; Notable for the following:    WBC 11.1 (*)    All other components within normal limits  BASIC METABOLIC PANEL - Abnormal; Notable for the following:    Glucose, Bld 153 (*)    All other components within normal limits  HEPATIC FUNCTION PANEL - Abnormal; Notable for the following:    Bilirubin, Direct <0.1 (*)    All other components within normal limits  BRAIN NATRIURETIC PEPTIDE  LIPASE, BLOOD  D-DIMER, QUANTITATIVE (NOT AT [facility])  Rosezena Sensor, ED    Imaging Review Dg Chest 2 View  05/13/2015   CLINICAL DATA:  Shortness of breath and left chest pain for 2 hours.  EXAM: CHEST  2 VIEW  COMPARISON:  PA and lateral chest 05/07/2015 and 01/03/2012.  FINDINGS: Heart size and mediastinal contours are within normal limits. Both lungs are clear. Visualized skeletal structures are unremarkable.  IMPRESSION: Negative exam.   Electronically Signed   By: Drusilla Kanner M.D.   On:  05/13/2015 20:50     EKG Interpretation   Date/Time:  Thursday May 13 2015 19:51:51 EDT Ventricular Rate:  70 PR Interval:  148 QRS Duration: 103 QT Interval:  396 QTC Calculation: 427 R Axis:   70 Text Interpretation:  Sinus rhythm Within normal limits When compared with  ECG of 05/09/2015, No significant change was found Confirmed by [facility]  MD,  DAVID ([id number]) on 05/13/2015 7:56:22 PM      MDM   Final diagnoses:  Pleurisy  SOB (shortness of breath)    9:03 PM Labs pending. I reviewed the chest xray which shows no acute changes. Vitals stable and patient afebrile. Patient declines pain medication at this time.   11:53 PM Patient's d-dimer negative. Patient was able to ambulate with pulse ox and maintain oxygen saturation  at 97%. Patient reports improvement of symptoms after toradol and valium. Patient will be discharged with naprosyn and robaxin for pleurisy and muscle spasm. Patient will have meclizine for vertigo.     Emilia Beck, PA-C 05/13/15 2355  Linwood Dibbles, MD 05/14/15 1350

## 2015-05-13 NOTE — ED Notes (Signed)
Per [facility], pt was seen here Friday due to CP, had cardiac cath done. Pt had an occlusion but wasn't given a stent. Pt given "medicine for the occlusion" per family. Pt discharged home. Pt started having chest pain 2 hours ago. N/v x1 with EMS. Pt given 4 zofran, 324 asa, 1 nitro, pain free for about 20 minutes, pain came back, given 2nd nitro by EMS, pt is pain free again. EMS done right side, and posterior EKG as well, all unremarkable. Pt is AAOX4, in NAD.

## 2015-05-13 NOTE — ED Notes (Signed)
Pt slowly ambulated through hallway with one standby assist, pt c/o dizziness and lightheadednes whil walking. Pt 97% o2 sats while walking

## 2015-05-13 NOTE — Discharge Instructions (Signed)
Take naprosyn for inflammation of the lung lining that is causing your irritation. Take Robaxin as needed for muscle spasm. Take meclizine for dizziness. You may take these medications together.

## 2015-05-14 NOTE — Telephone Encounter (Signed)
Patient contacted regarding discharge from [facility] on 05/10/15.  Patient understands to follow up in [place]. Patient understands discharge instructions? yes Patient understands medications and regiment? Yes  Patient is currently at [facility] for same problems. Patient's family is very confused as far as his care is concerned. He was in the middle of a scan when I spoke with wife. I verified that they had no questions regarding medications/discharge instructions.

## 2015-05-14 NOTE — ED Notes (Signed)
E-signature not working. Pt and family verbalized D/C instructions and prescriptions

## 2016-11-13 ENCOUNTER — Encounter (HOSPITAL_COMMUNITY): Payer: Self-pay | Admitting: Emergency Medicine

## 2016-11-13 ENCOUNTER — Emergency Department (HOSPITAL_COMMUNITY)
Admission: EM | Admit: 2016-11-13 | Discharge: 2016-11-13 | Disposition: A | Discharge status: Home / Self Care | Payer: Managed Care, Other (non HMO) | Attending: Emergency Medicine | Admitting: Emergency Medicine

## 2016-11-13 DIAGNOSIS — I10 Essential (primary) hypertension: Secondary | ICD-10-CM | POA: Insufficient documentation

## 2016-11-13 DIAGNOSIS — S0502XA Injury of conjunctiva and corneal abrasion without foreign body, left eye, initial encounter: Secondary | ICD-10-CM | POA: Insufficient documentation

## 2016-11-13 DIAGNOSIS — Y939 Activity, unspecified: Secondary | ICD-10-CM | POA: Insufficient documentation

## 2016-11-13 DIAGNOSIS — Y990 Civilian activity done for income or pay: Secondary | ICD-10-CM | POA: Diagnosis not present

## 2016-11-13 DIAGNOSIS — Y929 Unspecified place or not applicable: Secondary | ICD-10-CM | POA: Diagnosis not present

## 2016-11-13 DIAGNOSIS — Z7982 Long term (current) use of aspirin: Secondary | ICD-10-CM | POA: Insufficient documentation

## 2016-11-13 DIAGNOSIS — S0592XA Unspecified injury of left eye and orbit, initial encounter: Secondary | ICD-10-CM

## 2016-11-13 DIAGNOSIS — W268XXA Contact with other sharp object(s), not elsewhere classified, initial encounter: Secondary | ICD-10-CM | POA: Diagnosis not present

## 2016-11-13 MED ORDER — TETRACAINE HCL 0.5 % OP SOLN
2.0000 [drp] | Freq: Once | OPHTHALMIC | Status: AC
  Administered 2016-11-13: 2 [drp] via OPHTHALMIC
  Filled 2016-11-13: qty 4

## 2016-11-13 MED ORDER — ERYTHROMYCIN 5 MG/GM OP OINT
TOPICAL_OINTMENT | Freq: Once | OPHTHALMIC | Status: AC
  Administered 2016-11-13: 1 via OPHTHALMIC
  Filled 2016-11-13: qty 3.5

## 2016-11-13 MED ORDER — HYDROCODONE-ACETAMINOPHEN 5-325 MG PO TABS: 1.0000 | ORAL_TABLET | Freq: Four times a day (QID) | ORAL | 0 refills | Status: DC | PRN

## 2016-11-13 MED ORDER — FLUORESCEIN SODIUM 0.6 MG OP STRP
1.0000 | ORAL_STRIP | Freq: Once | OPHTHALMIC | Status: AC
  Administered 2016-11-13: 1 via OPHTHALMIC
  Filled 2016-11-13: qty 1

## 2016-11-13 MED ORDER — NAPROXEN 500 MG PO TABS: 500.0000 mg | ORAL_TABLET | Freq: Two times a day (BID) | ORAL | 0 refills | Status: AC

## 2016-11-13 NOTE — ED Triage Notes (Signed)
Pt reports that one day ago while grinding metal at work something flew into his left eye

## 2016-11-13 NOTE — ED Provider Notes (Signed)
[facility]-EMERGENCY DEPT Provider Note   CSN: [id number] Arrival date & time: 11/13/16  2012     History   Chief Complaint Chief Complaint  Patient presents with  . Eye Injury    HPI Alejandro Turner is a 55 y.o. male.  HPI   Alejandro CoffinBenjamin Simcoe is a 55 y.o. male, with a history of HTN, presenting to the ED with A left eye injury that occurred yesterday. Patient states he was grinding metal when a piece of metal flew into his left eye. He was wearing eye protection, but it was not full coverage protection. Pain is moderate to severe, aching and sharp, nonradiating. Patient has not taken any medications for his pain. Denies vision changes, eye drainage, or any other complaints. Denies contact lens use.   Past Medical History:  Diagnosis Date  . Allergy   . Borderline diabetes   . Chest pain    a. 2013 nl Stress Echo;  b. 04/2015 Cath: nl cors, nl LV fxn.  . Daily headache   . GERD (gastroesophageal reflux disease)   . High cholesterol   . History of stomach ulcers   . Hypertension   . Kidney stones     Patient Active Problem List   Diagnosis Date Noted  . Chest pain at rest   . Chest pain with moderate risk for cardiac etiology 05/07/2015  . Hyponatremia 01/02/2012  . Fatigue 12/20/2011  . Borderline diabetes 12/19/2011  . Chest pain 12/17/2011  . HTN (hypertension) 12/17/2011  . Mixed hyperlipidemia 12/17/2011  . Left shoulder pain 12/17/2011    Past Surgical History:  Procedure Laterality Date  . BACK SURGERY    . CARDIAC CATHETERIZATION  03/2010  . CARDIAC CATHETERIZATION N/A 05/10/2015   Procedure: Left Heart Cath and Coronary Angiography;  Surgeon: Runell GessJonathan J Berry, MD;  [facility] LAB;  Service: Cardiovascular;  Laterality: N/A;  . LUMBAR DISC SURGERY  2005  . NOSE SURGERY  2005  . SHOULDER ARTHROSCOPY W/ ROTATOR CUFF REPAIR Left 1992  . UVULOPALATOPHARYNGOPLASTY  ~ 2009       Home Medications    Prior to Admission medications   Medication  Sig Start Date End Date Taking? Authorizing Provider  aspirin EC 81 MG tablet Take 81 mg by mouth at bedtime.    Yes Historical Provider, MD  atorvastatin (LIPITOR) 10 MG tablet Take 10 mg by mouth at bedtime.    Yes Historical Provider, MD  Cholecalciferol (VITAMIN D) 2000 UNITS tablet Take 2,000 Units by mouth daily.   Yes Historical Provider, MD  ezetimibe (ZETIA) 10 MG tablet Take 10 mg by mouth at bedtime.    Yes Historical Provider, MD  olmesartan (BENICAR) 20 MG tablet Take 20 mg by mouth daily.    Yes Historical Provider, MD  omeprazole (PRILOSEC) 40 MG capsule Take 40 mg by mouth daily.   Yes Historical Provider, MD  HYDROcodone-acetaminophen (NORCO/VICODIN) 5-325 MG tablet Take 1-2 tablets by mouth every 6 (six) hours as needed. 11/13/16   Spenser Harren C Giavonni Cizek, PA-C  naproxen (NAPROSYN) 500 MG tablet Take 1 tablet (500 mg total) by mouth 2 (two) times daily. 11/13/16   Anselm PancoastShawn C Jalessa Peyser, PA-C    Family History Family History  Problem Relation Age of Onset  . Kidney disease Mother   . Hypertension Mother   . Heart disease Mother   . Hyperlipidemia Mother   . Cancer Mother     Ovarian Cancer, Breast Cancer  . Diabetes Father   . Hypertension Father   .  Heart disease Father   . Hyperlipidemia Father   . Heart disease Sister   . Hyperlipidemia Sister   . Kidney disease Brother     Social History Social History  Substance Use Topics  . Smoking status: Never Smoker  . Smokeless tobacco: Never Used  . Alcohol use No     Allergies   Contrast media [iodinated diagnostic agents]; Food; Shrimp [shellfish allergy]; and Tree extract   Review of Systems Review of Systems  Constitutional: Negative for fever.  Eyes: Positive for pain and redness. Negative for visual disturbance.  Neurological: Negative for headaches.     Physical Exam Updated Vital Signs BP 146/77 (BP Location: Left Arm)   Pulse 75   Temp 98 F (36.7 C) (Oral)   Resp 15   Ht 5\' 6"  (1.676 m)   Wt 86.2 kg   SpO2  99%   BMI 30.67 kg/m   Physical Exam  Constitutional: He appears well-developed and well-nourished. No distress.  HENT:  Head: Normocephalic and atraumatic.  Eyes: Conjunctivae and EOM are normal. Pupils are equal, round, and reactive to light.  No contact lenses in place.  Woods Lamp exam shows increased fluorescein uptake in the sclera under the upper left lid at about the 12 o'clock position.  There is a much smaller area of fluorescein uptake in the central cornea with a likely foreign body present. Slit lamp exam was also performed with what appears to be a scleral abrasion in the above position. No noted signs of corneal abrasion or ulcer, iritis, anterior chamber damage, foreign bodies, or globe damage.  Tono-Pen values: Right eye: 23  Left eye: 17    Visual Acuity  Right Eye Distance: 20/50 Left Eye Distance: 20/50 Bilateral Distance: 20/40  Right Eye Near:   Left Eye Near:    Bilateral Near:      Neck: Neck supple.  Cardiovascular: Normal rate and regular rhythm.   Pulmonary/Chest: Effort normal.  Neurological: He is alert.  Skin: Skin is warm and dry. He is not diaphoretic.  Psychiatric: He has a normal mood and affect. His behavior is normal.  Nursing note and vitals reviewed.    ED Treatments / Results  Labs (all labs ordered are listed, but only abnormal results are displayed) Labs Reviewed - No data to display  EKG  EKG Interpretation None       Radiology No results found.  Procedures .Foreign Body Removal Date/Time: 11/13/2016 9:48 PM Performed by: Anselm Pancoast Authorized by: Anselm Pancoast  Consent: Verbal consent obtained. Risks and benefits: risks, benefits and alternatives were discussed Consent given by: patient Patient understanding: patient states understanding of the procedure being performed Patient consent: the patient's understanding of the procedure matches consent given Procedure consent: procedure consent matches procedure  scheduled Patient identity confirmed: verbally with patient and arm band Body area: eye Location details: left cornea  Anesthesia: Local Anesthetic: tetracaine drops  Sedation: Patient sedated: no Patient restrained: no Patient cooperative: yes Localization method: slit lamp Removal mechanism: moist cotton swab Eye examined with fluorescein. Fluorescein uptake. Dressing: antibiotic ointment Depth: superficial Complexity: simple 1 objects recovered. Post-procedure assessment: foreign body removed Patient tolerance: Patient tolerated the procedure well with no immediate complications   (including critical care time)  Medications Ordered in ED Medications  tetracaine (PONTOCAINE) 0.5 % ophthalmic solution 2 drop (not administered)  fluorescein ophthalmic strip 1 strip (not administered)  erythromycin ophthalmic ointment (not administered)     Initial Impression / Assessment and Plan /  ED Course  I have reviewed the triage vital signs and the nursing notes.  Pertinent labs & imaging results that were available during my care of the patient were reviewed by me and considered in my medical decision making (see chart for details).  Clinical Course     Patient presents with a left eye injury that occurred yesterday. Scleral abrasion and foreign body noted. No visual deficits or other red flag symptoms. Patient sent home with erythromycin ointment along with instructions. Ophthalmology follow-up. Return precautions discussed.  Vitals:   11/13/16 2017 11/13/16 2018  BP:  146/77  Pulse:  75  Resp:  15  Temp:  98 F (36.7 C)  TempSrc:  Oral  SpO2:  99%  Weight: 86.2 kg   Height: 5\' 6"  (1.676 m)      Final Clinical Impressions(s) / ED Diagnoses   Final diagnoses:  Left eye injury, initial encounter    New Prescriptions New Prescriptions   HYDROCODONE-ACETAMINOPHEN (NORCO/VICODIN) 5-325 MG TABLET    Take 1-2 tablets by mouth every 6 (six) hours as needed.    NAPROXEN (NAPROSYN) 500 MG TABLET    Take 1 tablet (500 mg total) by mouth 2 (two) times daily.     Anselm PancoastShawn C Zarinah Oviatt, PA-C 11/13/16 2215    Rolan BuccoMelanie Belfi, MD 11/14/16 [phone]

## 2016-11-13 NOTE — Discharge Instructions (Signed)
There is evidence of an abrasion to the eye. Administer the erythromycin ointment 4 times a day for the next 5 days. It is very important that you follow up with ophthalmology as soon as possible. Call the number provided to set up an appointment. Return to the ED should symptoms worsen, you experience changes in your vision, or any other major issues arise.  Naproxen and ibuprofen for pain. Vicodin for severe pain. Do not drive or perform other dangerous activities while taking the Vicodin.

## 2018-01-25 ENCOUNTER — Other Ambulatory Visit: Payer: Self-pay

## 2018-01-25 ENCOUNTER — Emergency Department (HOSPITAL_COMMUNITY)
Admission: EM | Admit: 2018-01-25 | Discharge: 2018-01-25 | Disposition: A | Discharge status: Home / Self Care | Payer: Managed Care, Other (non HMO) | Attending: Emergency Medicine | Admitting: Emergency Medicine

## 2018-01-25 ENCOUNTER — Emergency Department (HOSPITAL_COMMUNITY): Payer: Managed Care, Other (non HMO)

## 2018-01-25 ENCOUNTER — Encounter (HOSPITAL_COMMUNITY): Payer: Self-pay | Admitting: Emergency Medicine

## 2018-01-25 DIAGNOSIS — R079 Chest pain, unspecified: Secondary | ICD-10-CM | POA: Diagnosis present

## 2018-01-25 DIAGNOSIS — I10 Essential (primary) hypertension: Secondary | ICD-10-CM | POA: Diagnosis not present

## 2018-01-25 DIAGNOSIS — R51 Headache: Secondary | ICD-10-CM | POA: Diagnosis not present

## 2018-01-25 DIAGNOSIS — Z79899 Other long term (current) drug therapy: Secondary | ICD-10-CM | POA: Insufficient documentation

## 2018-01-25 DIAGNOSIS — Z7982 Long term (current) use of aspirin: Secondary | ICD-10-CM | POA: Diagnosis not present

## 2018-01-25 LAB — BASIC METABOLIC PANEL
Anion gap: 10 (ref 5–15)
BUN: 14 mg/dL (ref 6–20)
CALCIUM: 9.3 mg/dL (ref 8.9–10.3)
CHLORIDE: 103 mmol/L (ref 101–111)
CO2: 23 mmol/L (ref 22–32)
CREATININE: 1.06 mg/dL (ref 0.61–1.24)
GFR calc Af Amer: >60 mL/min (ref >60)
GFR calc non Af Amer: >60 mL/min (ref >60)
Glucose, Bld: 129 mg/dL — ABNORMAL HIGH (ref 65–99)
Potassium: 4.2 mmol/L (ref 3.5–5.1)
SODIUM: 136 mmol/L (ref 135–145)

## 2018-01-25 LAB — CBC
HCT: 43 % (ref 39.0–52.0)
Hemoglobin: 14.3 g/dL (ref 13.0–17.0)
MCH: 31.5 pg (ref 26.0–34.0)
MCHC: 33.3 g/dL (ref 30.0–36.0)
MCV: 94.7 fL (ref 78.0–100.0)
PLATELETS: 231 10*3/uL (ref 150–400)
RBC: 4.54 MIL/uL (ref 4.22–5.81)
RDW: 14.4 % (ref 11.5–15.5)
WBC: 5.6 10*3/uL (ref 4.0–10.5)

## 2018-01-25 LAB — I-STAT TROPONIN, ED: Troponin i, poc: 0 ng/mL (ref 0.00–0.08)

## 2018-01-25 MED ORDER — ONDANSETRON HCL 8 MG PO TABS: 8.0000 mg | ORAL_TABLET | Freq: Three times a day (TID) | ORAL | 0 refills | Status: AC | PRN

## 2018-01-25 MED ORDER — HYDROCODONE-ACETAMINOPHEN 5-325 MG PO TABS: 1.0000 | ORAL_TABLET | ORAL | 0 refills | Status: DC | PRN

## 2018-01-25 MED ORDER — FAMOTIDINE 20 MG PO TABS: 20.0000 mg | ORAL_TABLET | Freq: Two times a day (BID) | ORAL | 0 refills | Status: DC

## 2018-01-25 NOTE — ED Notes (Signed)
To ct

## 2018-01-25 NOTE — Discharge Instructions (Signed)
Prescription for pain, nausea, stomach irritation.  We did the following tests: CBC, chemistry panel, troponin, EKG, chest x-ray, CT head.  Glucose was minimally elevated.  Otherwise tests were normal.  Strongly recommend following up with your primary care doctor.  Return here if worse.

## 2018-01-25 NOTE — ED Triage Notes (Signed)
Reports radiation to left shoulder and neck onset yesterday. Pain is worsened with walking.

## 2018-01-25 NOTE — ED Triage Notes (Signed)
Pt to ER for central chest pressure 3-4 days with shortness of breath. A/o x4. States hx of hypertension and high cholesterol. Non-smoker.

## 2018-01-25 NOTE — ED Notes (Signed)
Has had vomiting and chills states his wife for the last few days

## 2018-01-25 NOTE — ED Provider Notes (Signed)
[facility] EMERGENCY DEPARTMENT Provider Note   CSN: [id number] Arrival date & time: 01/25/18  0818     History   Chief Complaint Chief Complaint  Patient presents with  . Chest Pain    HPI Alejandro Turner is a 57 y.o. male.  Patient presents with intermittent chest pain described as a sticking sensation lasting approximately 1 minute and not associated with any activity.  Pain radiates to the left shoulder, head, neck.  No associated dyspnea, diaphoresis.  Some nausea.  Non-smoker.  Cardiac risk factors include borderline diabetes, hypertension.  Severity is mild.      Past Medical History:  Diagnosis Date  . Allergy   . Borderline diabetes   . Chest pain    a. 2013 nl Stress Echo;  b. 04/2015 Cath: nl cors, nl LV fxn.  . Daily headache   . GERD (gastroesophageal reflux disease)   . High cholesterol   . History of stomach ulcers   . Hypertension   . Kidney stones     Patient Active Problem List   Diagnosis Date Noted  . Chest pain at rest   . Chest pain with moderate risk for cardiac etiology 05/07/2015  . Hyponatremia 01/02/2012  . Fatigue 12/20/2011  . Borderline diabetes 12/19/2011  . Chest pain 12/17/2011  . HTN (hypertension) 12/17/2011  . Mixed hyperlipidemia 12/17/2011  . Left shoulder pain 12/17/2011    Past Surgical History:  Procedure Laterality Date  . BACK SURGERY    . CARDIAC CATHETERIZATION  03/2010  . CARDIAC CATHETERIZATION N/A 05/10/2015   Procedure: Left Heart Cath and Coronary Angiography;  Surgeon: Runell Gess, MD;  Location: [facility] INVASIVE CV LAB;  Service: Cardiovascular;  Laterality: N/A;  . LUMBAR DISC SURGERY  2005  . NOSE SURGERY  2005  . SHOULDER ARTHROSCOPY W/ ROTATOR CUFF REPAIR Left 1992  . UVULOPALATOPHARYNGOPLASTY  ~ 2009       Home Medications    Prior to Admission medications   Medication Sig Start Date End Date Taking? Authorizing Provider  aspirin EC 81 MG tablet Take 81 mg by mouth at  bedtime.    Yes [provider]  atorvastatin (LIPITOR) 10 MG tablet Take 10 mg by mouth at bedtime.    Yes [provider]  ezetimibe (ZETIA) 10 MG tablet Take 10 mg by mouth at bedtime.    Yes [provider]  montelukast (SINGULAIR) 5 MG chewable tablet Chew 5 mg by mouth at bedtime.   Yes [provider]  olmesartan (BENICAR) 20 MG tablet Take 20 mg by mouth daily.    Yes [provider]  omeprazole (PRILOSEC) 40 MG capsule Take 40 mg by mouth daily.   Yes [provider]  famotidine (PEPCID) 20 MG tablet Take 1 tablet (20 mg total) by mouth 2 (two) times daily. 01/25/18   Donnetta Hutching, MD  HYDROcodone-acetaminophen (NORCO/VICODIN) 5-325 MG tablet Take 1 tablet by mouth every 4 (four) hours as needed. 01/25/18   Donnetta Hutching, MD  naproxen (NAPROSYN) 500 MG tablet Take 1 tablet (500 mg total) by mouth 2 (two) times daily. Patient not taking: Reported on 01/25/2018 11/13/16   Joy, Ines Bloomer C, PA-C  ondansetron (ZOFRAN) 8 MG tablet Take 1 tablet (8 mg total) by mouth every 8 (eight) hours as needed for nausea or vomiting. 01/25/18   Donnetta Hutching, MD    Family History Family History  Problem Relation Age of Onset  . Kidney disease Mother   . Hypertension Mother   .  Heart disease Mother   . Hyperlipidemia Mother   . Cancer Mother        Ovarian Cancer, Breast Cancer  . Diabetes Father   . Hypertension Father   . Heart disease Father   . Hyperlipidemia Father   . Heart disease Sister   . Hyperlipidemia Sister   . Kidney disease Brother     Social History Social History   Tobacco Use  . Smoking status: Never Smoker  . Smokeless tobacco: Never Used  Substance Use Topics  . Alcohol use: No  . Drug use: No     Allergies   Contrast media [iodinated diagnostic agents]; Food; Shrimp [shellfish allergy]; and Tree extract   Review of Systems Review of Systems  All other systems reviewed and are negative.    Physical Exam Updated Vital  Signs BP 128/76   Pulse 68   Temp 98.1 F (36.7 C) (Oral)   Resp 19   Ht 5\' 5"  (1.651 m)   Wt 86.2 kg (190 lb)   SpO2 94%   BMI 31.62 kg/m   Physical Exam  Constitutional: He is oriented to person, place, and time. He appears well-developed and well-nourished.  HENT:  Head: Normocephalic and atraumatic.  Eyes: Conjunctivae are normal.  Neck: Neck supple.  Cardiovascular: Normal rate and regular rhythm.  Pulmonary/Chest: Effort normal and breath sounds normal.  Abdominal: Soft. Bowel sounds are normal.  Musculoskeletal: Normal range of motion.  Neurological: He is alert and oriented to person, place, and time.  Skin: Skin is warm and dry.  Psychiatric: He has a normal mood and affect. His behavior is normal.  Nursing note and vitals reviewed.    ED Treatments / Results  Labs (all labs ordered are listed, but only abnormal results are displayed) Labs Reviewed  BASIC METABOLIC PANEL - Abnormal; Notable for the following components:      Result Value   Glucose, Bld 129 (*)    All other components within normal limits  CBC  I-STAT TROPONIN, ED    EKG  EKG Interpretation  Date/Time:  Friday January 25 2018 08:25:07 EST Ventricular Rate:  69 PR Interval:  144 QRS Duration: 110 QT Interval:  388 QTC Calculation: 415 R Axis:   73 Text Interpretation:  Normal sinus rhythm Normal ECG Confirmed by Donnetta Hutchingook, Bona Hubbard ([id number]) on 01/25/2018 9:22:25 AM       Radiology Dg Chest 2 View  Result Date: 01/25/2018 CLINICAL DATA:  Chest pain radiating to the left neck and shoulder for 1 week. Shortness of breath. EXAM: CHEST - 2 VIEW COMPARISON:  05/13/2015 FINDINGS: The heart size and mediastinal contours are within normal limits. Both lungs are clear. The visualized skeletal structures are unremarkable. IMPRESSION: No active cardiopulmonary disease. Electronically Signed   By: Elige KoHetal  Patel   On: 01/25/2018 08:52   Ct Head Wo Contrast  Result Date: 01/25/2018 CLINICAL DATA:  Frontal  headache with nausea and vomiting over the last 2 days. EXAM: CT HEAD WITHOUT CONTRAST TECHNIQUE: Contiguous axial images were obtained from the base of the skull through the vertex without intravenous contrast. COMPARISON:  None. FINDINGS: Brain: The brain shows a normal appearance without evidence of malformation, atrophy, old or acute small or large vessel infarction, mass lesion, hemorrhage, hydrocephalus or extra-axial collection. Vascular: No hyperdense vessel. No evidence of atherosclerotic calcification. Skull: Normal.  No traumatic finding.  No focal bone lesion. Sinuses/Orbits: Sinuses are clear. Orbits appear normal. Mastoids are clear. Other: None significant IMPRESSION: Normal head CT Electronically Signed  By: Paulina Fusi M.D.   On: 01/25/2018 11:00    Procedures Procedures (including critical care time)  Medications Ordered in ED Medications - No data to display   Initial Impression / Assessment and Plan / ED Course  I have reviewed the triage vital signs and the nursing notes.  Pertinent labs & imaging results that were available during my care of the patient were reviewed by me and considered in my medical decision making (see chart for details).     Patient presents with intermittent chest pain not associated with any activity.  The pain is atypical for anginal pain.  Workup in the ED reveals a normal EKG, normal troponin, minimally elevated glucose.  He has been encouraged to follow-up with his primary care doctor in [place] or return here if any symptoms worsen.  Final Clinical Impressions(s) / ED Diagnoses   Final diagnoses:  Chest pain, unspecified type    ED Discharge Orders        Ordered    HYDROcodone-acetaminophen (NORCO/VICODIN) 5-325 MG tablet  Every 4 hours PRN     01/25/18 1255    ondansetron (ZOFRAN) 8 MG tablet  Every 8 hours PRN     01/25/18 1255    famotidine (PEPCID) 20 MG tablet  2 times daily     01/25/18 1255       Donnetta Hutching,  MD 01/25/18 1409

## 2019-06-24 ENCOUNTER — Other Ambulatory Visit: Payer: Self-pay

## 2019-06-24 DIAGNOSIS — Z20822 Contact with and (suspected) exposure to covid-19: Secondary | ICD-10-CM

## 2019-06-25 LAB — NOVEL CORONAVIRUS, NAA: SARS-CoV-2, NAA: NOT DETECTED

## 2019-09-23 ENCOUNTER — Other Ambulatory Visit: Payer: Self-pay

## 2019-09-23 DIAGNOSIS — Z20822 Contact with and (suspected) exposure to covid-19: Secondary | ICD-10-CM

## 2019-09-25 LAB — NOVEL CORONAVIRUS, NAA: SARS-CoV-2, NAA: NOT DETECTED

## 2019-11-25 ENCOUNTER — Emergency Department (HOSPITAL_COMMUNITY)
Admission: EM | Admit: 2019-11-25 | Discharge: 2019-11-25 | Disposition: A | Discharge status: Home / Self Care | Payer: 59 | Attending: Emergency Medicine | Admitting: Emergency Medicine

## 2019-11-25 ENCOUNTER — Other Ambulatory Visit: Payer: Self-pay

## 2019-11-25 ENCOUNTER — Encounter (HOSPITAL_COMMUNITY): Payer: Self-pay | Admitting: *Deleted

## 2019-11-25 ENCOUNTER — Emergency Department (HOSPITAL_COMMUNITY): Payer: 59

## 2019-11-25 DIAGNOSIS — R079 Chest pain, unspecified: Secondary | ICD-10-CM

## 2019-11-25 DIAGNOSIS — Z20822 Contact with and (suspected) exposure to covid-19: Secondary | ICD-10-CM | POA: Diagnosis not present

## 2019-11-25 DIAGNOSIS — R0789 Other chest pain: Secondary | ICD-10-CM | POA: Insufficient documentation

## 2019-11-25 LAB — BASIC METABOLIC PANEL
Anion gap: 10 (ref 5–15)
BUN: 15 mg/dL (ref 6–20)
CO2: 21 mmol/L — ABNORMAL LOW (ref 22–32)
Calcium: 9.6 mg/dL (ref 8.9–10.3)
Chloride: 104 mmol/L (ref 98–111)
Creatinine, Ser: 1.12 mg/dL (ref 0.61–1.24)
GFR calc Af Amer: >60 mL/min (ref >60)
GFR calc non Af Amer: >60 mL/min (ref >60)
Glucose, Bld: 151 mg/dL — ABNORMAL HIGH (ref 70–99)
Potassium: 4.7 mmol/L (ref 3.5–5.1)
Sodium: 135 mmol/L (ref 135–145)

## 2019-11-25 LAB — CBC
HCT: 43.5 % (ref 39.0–52.0)
Hemoglobin: 14.4 g/dL (ref 13.0–17.0)
MCH: 31.4 pg (ref 26.0–34.0)
MCHC: 33.1 g/dL (ref 30.0–36.0)
MCV: 94.8 fL (ref 80.0–100.0)
Platelets: 257 10*3/uL (ref 150–400)
RBC: 4.59 MIL/uL (ref 4.22–5.81)
RDW: 13.2 % (ref 11.5–15.5)
WBC: 4 10*3/uL (ref 4.0–10.5)
nRBC: 0 % (ref 0.0–0.2)

## 2019-11-25 LAB — TROPONIN I (HIGH SENSITIVITY)
Troponin I (High Sensitivity): 3 ng/L (ref <18)
Troponin I (High Sensitivity): <2 ng/L (ref <18)

## 2019-11-25 MED ORDER — SODIUM CHLORIDE 0.9% FLUSH: 3.0000 mL | Freq: Once | INTRAVENOUS | Status: DC

## 2019-11-25 MED ORDER — PROMETHAZINE HCL 25 MG PO TABS: 25.0000 mg | ORAL_TABLET | Freq: Four times a day (QID) | ORAL | 0 refills | Status: AC | PRN

## 2019-11-25 NOTE — ED Triage Notes (Signed)
Pt reports onset last night of sob with left side pain, headache and chest discomfort with mild cough. Denies fever. No acute distress noted at triage. Reports cardiac hx.

## 2019-11-25 NOTE — Discharge Instructions (Addendum)
Return here as needed.  Follow-up with your primary doctor.  You will need to quarantine until your Covid results have returned.

## 2019-11-25 NOTE — ED Notes (Signed)
Unsuccessful lab draw x1. 

## 2019-11-25 NOTE — ED Provider Notes (Signed)
[place] EMERGENCY DEPARTMENT Provider Note   CSN: [id number] Arrival date & time: 11/25/19  0932     History Chief Complaint  Patient presents with  . Chest Pain    Alejandro Turner is a 59 y.o. male.  HPI Patient presents to the emergency department with diarrhea, chest pain, generalized abdominal discomfort and headache.  The patient states that the symptoms started yesterday.  Patient states that his chest pain last less than 30 seconds at a time.  The patient states that nothing seems to pay condition better or worse.  Patient states that he is possibly had a mild cough.  The patient states that he would really like a Covid test.  The patient denies blurred vision, neck pain, fever, cough, weakness, numbness, dizziness, anorexia, edema, abdominal pain, nausea, vomiting,  rash, back pain, dysuria, hematemesis, bloody stool, near syncope, or syncope.    Past Medical History:  Diagnosis Date  . Allergy   . Borderline diabetes   . Chest pain    a. 2013 nl Stress Echo;  b. 04/2015 Cath: nl cors, nl LV fxn.  . Daily headache   . GERD (gastroesophageal reflux disease)   . High cholesterol   . History of stomach ulcers   . Hypertension   . Kidney stones     Patient Active Problem List   Diagnosis Date Noted  . Chest pain at rest   . Chest pain with moderate risk for cardiac etiology 05/07/2015  . Hyponatremia 01/02/2012  . Fatigue 12/20/2011  . Borderline diabetes 12/19/2011  . Chest pain 12/17/2011  . HTN (hypertension) 12/17/2011  . Mixed hyperlipidemia 12/17/2011  . Left shoulder pain 12/17/2011    Past Surgical History:  Procedure Laterality Date  . BACK SURGERY    . CARDIAC CATHETERIZATION  03/2010  . CARDIAC CATHETERIZATION N/A 05/10/2015   Procedure: Left Heart Cath and Coronary Angiography;  Surgeon: Lorretta Harp, MD;  Location: [place] CV LAB;  Service: Cardiovascular;  Laterality: N/A;  . LUMBAR [place] SURGERY  2005  . NOSE SURGERY   2005  . SHOULDER ARTHROSCOPY W/ ROTATOR CUFF REPAIR Left 1992  . UVULOPALATOPHARYNGOPLASTY  ~ 2009       Family History  Problem Relation Age of Onset  . Kidney disease Mother   . Hypertension Mother   . Heart disease Mother   . Hyperlipidemia Mother   . Cancer Mother        Ovarian Cancer, Breast Cancer  . Diabetes Father   . Hypertension Father   . Heart disease Father   . Hyperlipidemia Father   . Heart disease Sister   . Hyperlipidemia Sister   . Kidney disease Brother     Social History   Tobacco Use  . Smoking status: Never Smoker  . Smokeless tobacco: Never Used  Substance Use Topics  . Alcohol use: No  . Drug use: No    Home Medications Prior to Admission medications   Medication Sig Start Date End Date Taking? Authorizing Provider  aspirin EC 81 MG tablet Take 81 mg by mouth at bedtime.     [provider]  atorvastatin (LIPITOR) 10 MG tablet Take 10 mg by mouth at bedtime.     [provider]  ezetimibe (ZETIA) 10 MG tablet Take 10 mg by mouth at bedtime.     [provider]  famotidine (PEPCID) 20 MG tablet Take 1 tablet (20 mg total) by mouth 2 (two) times daily. 01/25/18   Nat Christen,  MD  HYDROcodone-acetaminophen (NORCO/VICODIN) 5-325 MG tablet Take 1 tablet by mouth every 4 (four) hours as needed. 01/25/18   Donnetta Hutching, MD  montelukast (SINGULAIR) 5 MG chewable tablet Chew 5 mg by mouth at bedtime.    [provider]  naproxen (NAPROSYN) 500 MG tablet Take 1 tablet (500 mg total) by mouth 2 (two) times daily. Patient not taking: Reported on 01/25/2018 11/13/16   Joy, Ines Bloomer C, PA-C  olmesartan (BENICAR) 20 MG tablet Take 20 mg by mouth daily.     [provider]  omeprazole (PRILOSEC) 40 MG capsule Take 40 mg by mouth daily.    [provider]  ondansetron (ZOFRAN) 8 MG tablet Take 1 tablet (8 mg total) by mouth every 8 (eight) hours as needed for nausea or vomiting. 01/25/18   Donnetta Hutching, MD    Allergies     Contrast media [iodinated diagnostic agents], Food, Shrimp [shellfish allergy], and Tree extract  Review of Systems   Review of Systems All other systems negative except as documented in the HPI. All pertinent positives and negatives as reviewed in the HPI. Physical Exam Updated Vital Signs BP 115/64   Pulse 73   Temp 97.9 F (36.6 C) (Oral)   Resp 18   Ht 5\' 6"  (1.676 m)   Wt 81.6 kg   SpO2 100%   BMI 29.05 kg/m   Physical Exam Vitals and nursing note reviewed.  Constitutional:      General: He is not in acute distress.    Appearance: He is well-developed.  HENT:     Head: Normocephalic and atraumatic.  Eyes:     Pupils: Pupils are equal, round, and reactive to light.  Cardiovascular:     Rate and Rhythm: Normal rate and regular rhythm.     Heart sounds: Normal heart sounds. No murmur. No friction rub. No gallop.   Pulmonary:     Effort: Pulmonary effort is normal. No respiratory distress.     Breath sounds: Normal breath sounds. No decreased breath sounds, wheezing, rhonchi or rales.  Abdominal:     General: Bowel sounds are normal. There is no distension.     Palpations: Abdomen is soft.     Tenderness: There is no abdominal tenderness.  Musculoskeletal:     Cervical back: Normal range of motion and neck supple.  Skin:    General: Skin is warm and dry.     Capillary Refill: Capillary refill takes less than 2 seconds.     Findings: No erythema or rash.  Neurological:     Mental Status: He is alert and oriented to person, place, and time.     Motor: No abnormal muscle tone.     Coordination: Coordination normal.  Psychiatric:        Behavior: Behavior normal.     ED Results / Procedures / Treatments   Labs (all labs ordered are listed, but only abnormal results are displayed) Labs Reviewed  BASIC METABOLIC PANEL - Abnormal; Notable for the following components:      Result Value   CO2 21 (*)    Glucose, Bld 151 (*)    All other components within normal  limits  NOVEL CORONAVIRUS, NAA (HOSP ORDER, SEND-OUT TO REF LAB; TAT 18-24 HRS)  CBC  TROPONIN I (HIGH SENSITIVITY)  TROPONIN I (HIGH SENSITIVITY)    EKG None  Radiology DG Chest 2 View  Result Date: 11/25/2019 CLINICAL DATA:  Chest pain.  Chest discomfort. EXAM: CHEST - 2 VIEW COMPARISON:  01/25/2018.  05/13/2015.  09/08/2011. FINDINGS: Mediastinum hilar structures normal. Stable appear faint nipple shadow on the left noted. Lungs are clear. No pleural effusion or pneumothorax. Degenerative change thoracic spine. IMPRESSION: No acute cardiopulmonary disease. Electronically Signed   By: Maisie Fus  Register   On: 11/25/2019 09:42    Procedures Procedures (including critical care time)  Medications Ordered in ED Medications  sodium chloride flush (NS) 0.9 % injection 3 mL (3 mLs Intravenous Not Given 11/25/19 1936)    ED Course  I have reviewed the triage vital signs and the nursing notes.  Pertinent labs & imaging results that were available during my care of the patient were reviewed by me and considered in my medical decision making (see chart for details).    MDM Rules/Calculators/A&P                      Patient be tested for Covid.  His laboratory testing does not show any significant abnormality.  Patient does not have any fever or significant upper respiratory symptoms at this time.  I did advise him that he will need to quarantine until these results have returned.  The patient is advised to follow-up with his primary doctor. Final Clinical Impression(s) / ED Diagnoses Final diagnoses:  None    Rx / DC Orders ED Discharge Orders    None       Kyra Manges 11/25/19 Derald Macleod, MD 11/26/19 2310

## 2019-11-26 LAB — NOVEL CORONAVIRUS, NAA (HOSP ORDER, SEND-OUT TO REF LAB; TAT 18-24 HRS): SARS-CoV-2, NAA: NOT DETECTED

## 2020-01-04 ENCOUNTER — Emergency Department (HOSPITAL_COMMUNITY): Payer: 59

## 2020-01-04 ENCOUNTER — Other Ambulatory Visit: Payer: Self-pay

## 2020-01-04 ENCOUNTER — Emergency Department (HOSPITAL_COMMUNITY)
Admission: EM | Admit: 2020-01-04 | Discharge: 2020-01-04 | Disposition: A | Discharge status: Home / Self Care | Payer: 59 | Attending: Emergency Medicine | Admitting: Emergency Medicine

## 2020-01-04 ENCOUNTER — Encounter (HOSPITAL_COMMUNITY): Payer: Self-pay | Admitting: *Deleted

## 2020-01-04 DIAGNOSIS — Z7982 Long term (current) use of aspirin: Secondary | ICD-10-CM | POA: Insufficient documentation

## 2020-01-04 DIAGNOSIS — W108XXA Fall (on) (from) other stairs and steps, initial encounter: Secondary | ICD-10-CM | POA: Insufficient documentation

## 2020-01-04 DIAGNOSIS — Z79899 Other long term (current) drug therapy: Secondary | ICD-10-CM | POA: Diagnosis not present

## 2020-01-04 DIAGNOSIS — I10 Essential (primary) hypertension: Secondary | ICD-10-CM | POA: Insufficient documentation

## 2020-01-04 DIAGNOSIS — W19XXXA Unspecified fall, initial encounter: Secondary | ICD-10-CM

## 2020-01-04 DIAGNOSIS — M79631 Pain in right forearm: Secondary | ICD-10-CM | POA: Diagnosis not present

## 2020-01-04 MED ORDER — HYDROCODONE-ACETAMINOPHEN 5-325 MG PO TABS: 1.0000 | ORAL_TABLET | Freq: Four times a day (QID) | ORAL | 0 refills | Status: AC | PRN

## 2020-01-04 MED ORDER — HYDROCODONE-ACETAMINOPHEN 5-325 MG PO TABS
1.0000 | ORAL_TABLET | Freq: Once | ORAL | Status: AC
  Administered 2020-01-04: 1 via ORAL
  Filled 2020-01-04: qty 1

## 2020-01-04 MED ORDER — ACETAMINOPHEN 500 MG PO TABS: 500.0000 mg | ORAL_TABLET | Freq: Four times a day (QID) | ORAL | 0 refills | Status: AC | PRN

## 2020-01-04 NOTE — ED Triage Notes (Addendum)
Fell this morning around 0200, pain in upper rt forearm and left middle finger.

## 2020-01-04 NOTE — Discharge Instructions (Signed)
Please read the attachment on RICE therapy.  Please follow-up with orthopedics for ongoing evaluation management of your injuries should they fail to improve with conservative therapy.  Please take your medications, as prescribed.  You were given narcotic and or sedative medications while in the emergency department. Do not drive. Do not use machinery or power tools. Do not sign legal documents. Do not drink alcohol. Do not take sleeping pills. Do not supervise children by yourself. Do not participate in activities that require climbing or being in high places.  Please return to the ED or seek immediate medical attention for any new or worsening symptoms.

## 2020-01-04 NOTE — ED Notes (Signed)
Pt verbalized understanding his discharge instructions.

## 2020-01-04 NOTE — ED Provider Notes (Signed)
[facility]-EMERGENCY DEPT Provider Note   CSN: [id number] Arrival date & time: 01/04/20  0569     History Chief Complaint  Patient presents with  . Arm Pain  . Fall    Alejandro Turner is a 59 y.o. male with PMH significant for PUD, GERD, HTN, and HLD presents to the ED after sustaining a mechanical fall last evening.  Patient reports that he was walking down the stairs of his basement when he tripped and fell.  He complains of significant, 8 out of 10 nonradiating right midshaft ulnar pain.  He also complains of pain to the radial aspect of the PIP joint on the left third finger.  He denies any head injury, LOC, presyncopal prodrome, seizure, weakness or numbness, or other neurologic symptoms.  He has taken one Tylenol, with some effect.  No reported hx of substance abuse.   HPI     Past Medical History:  Diagnosis Date  . Allergy   . Borderline diabetes   . Chest pain    a. 2013 nl Stress Echo;  b. 04/2015 Cath: nl cors, nl LV fxn.  . Daily headache   . GERD (gastroesophageal reflux disease)   . High cholesterol   . History of stomach ulcers   . Hypertension   . Kidney stones     Patient Active Problem List   Diagnosis Date Noted  . Chest pain at rest   . Chest pain with moderate risk for cardiac etiology 05/07/2015  . Hyponatremia 01/02/2012  . Fatigue 12/20/2011  . Borderline diabetes 12/19/2011  . Chest pain 12/17/2011  . HTN (hypertension) 12/17/2011  . Mixed hyperlipidemia 12/17/2011  . Left shoulder pain 12/17/2011    Past Surgical History:  Procedure Laterality Date  . BACK SURGERY    . CARDIAC CATHETERIZATION  03/2010  . CARDIAC CATHETERIZATION N/A 05/10/2015   Procedure: Left Heart Cath and Coronary Angiography;  Surgeon: Runell Gess, MD;  Location: [facility] INVASIVE CV LAB;  Service: Cardiovascular;  Laterality: N/A;  . LUMBAR DISC SURGERY  2005  . NOSE SURGERY  2005  . SHOULDER ARTHROSCOPY W/ ROTATOR CUFF REPAIR Left 1992  .  UVULOPALATOPHARYNGOPLASTY  ~ 2009       Family History  Problem Relation Age of Onset  . Kidney disease Mother   . Hypertension Mother   . Heart disease Mother   . Hyperlipidemia Mother   . Cancer Mother        Ovarian Cancer, Breast Cancer  . Diabetes Father   . Hypertension Father   . Heart disease Father   . Hyperlipidemia Father   . Heart disease Sister   . Hyperlipidemia Sister   . Kidney disease Brother     Social History   Tobacco Use  . Smoking status: Never Smoker  . Smokeless tobacco: Never Used  Substance Use Topics  . Alcohol use: No  . Drug use: No    Home Medications Prior to Admission medications   Medication Sig Start Date End Date Taking? Authorizing Provider  acetaminophen (TYLENOL) 500 MG tablet Take 1 tablet (500 mg total) by mouth every 6 (six) hours as needed. 01/04/20   Lorelee New, PA-C  aspirin EC 81 MG tablet Take 81 mg by mouth at bedtime.     [provider]  atorvastatin (LIPITOR) 10 MG tablet Take 10 mg by mouth at bedtime.     [provider]  ezetimibe (ZETIA) 10 MG tablet Take 10 mg by mouth at bedtime.  [provider]  famotidine (PEPCID) 20 MG tablet Take 1 tablet (20 mg total) by mouth 2 (two) times daily. 01/25/18   Nat Christen, MD  HYDROcodone-acetaminophen (NORCO/VICODIN) 5-325 MG tablet Take 1 tablet by mouth every 6 (six) hours as needed for severe pain. 01/04/20   Corena Herter, PA-C  montelukast (SINGULAIR) 5 MG chewable tablet Chew 5 mg by mouth at bedtime.    [provider]  naproxen (NAPROSYN) 500 MG tablet Take 1 tablet (500 mg total) by mouth 2 (two) times daily. Patient not taking: Reported on 01/25/2018 11/13/16   Joy, Raquel Sarna C, PA-C  olmesartan (BENICAR) 20 MG tablet Take 20 mg by mouth daily.     [provider]  omeprazole (PRILOSEC) 40 MG capsule Take 40 mg by mouth daily.    [provider]  ondansetron (ZOFRAN) 8 MG tablet Take 1 tablet (8 mg total) by  mouth every 8 (eight) hours as needed for nausea or vomiting. 01/25/18   Nat Christen, MD  promethazine (PHENERGAN) 25 MG tablet Take 1 tablet (25 mg total) by mouth every 6 (six) hours as needed for nausea or vomiting. 11/25/19   Lawyer, Harrell Gave, PA-C    Allergies    Contrast media [iodinated diagnostic agents], Food, Shrimp [shellfish allergy], and Tree extract  Review of Systems   Review of Systems  Respiratory: Negative for shortness of breath.   Cardiovascular: Negative for chest pain.  Gastrointestinal: Negative for abdominal pain and nausea.  Musculoskeletal: Positive for joint swelling.  Skin: Positive for color change.  Neurological: Negative for dizziness and headaches.  Hematological: Does not bruise/bleed easily.    Physical Exam Updated Vital Signs BP 139/72 (BP Location: Left Arm)   Temp 98.1 F (36.7 C) (Oral)   Resp 15   Ht 5\' 6"  (1.676 m)   Wt 83 kg   SpO2 100%   BMI 29.54 kg/m   Physical Exam Vitals and nursing note reviewed. Exam conducted with a chaperone present.  Constitutional:      General: He is not in acute distress.    Appearance: Normal appearance. He is not ill-appearing.  HENT:     Head: Normocephalic and atraumatic.  Eyes:     General: No scleral icterus.    Conjunctiva/sclera: Conjunctivae normal.  Cardiovascular:     Rate and Rhythm: Normal rate and regular rhythm.     Pulses: Normal pulses.     Heart sounds: Normal heart sounds.  Pulmonary:     Effort: Pulmonary effort is normal. No respiratory distress.     Breath sounds: Normal breath sounds.  Musculoskeletal:     Cervical back: Normal range of motion and neck supple.     Comments: Right arm: Significant swelling and ecchymoses overlying midshaft ulna.  Moderate TTP.  ROM limited due to pain and discomfort.  No wrist, humeral, shoulder, or clavicular TTP.  Sensation intact throughout. Left hand: Mild ecchymoses overlying PIP joint third finger.  Sensation intact.  ROM mildly  limited.  Skin:    General: Skin is dry.     Capillary Refill: Capillary refill takes less than 2 seconds.  Neurological:     Mental Status: He is alert and oriented to person, place, and time.     GCS: GCS eye subscore is 4. GCS verbal subscore is 5. GCS motor subscore is 6.  Psychiatric:        Mood and Affect: Mood normal.        Behavior: Behavior normal.  Thought Content: Thought content normal.        ED Results / Procedures / Treatments   Labs (all labs ordered are listed, but only abnormal results are displayed) Labs Reviewed - No data to display  EKG None  Radiology DG Forearm Right  Result Date: 01/04/2020 CLINICAL DATA:  Fall EXAM: RIGHT FOREARM - 2 VIEW COMPARISON:  None. FINDINGS: There is no evidence of fracture or other focal bone lesions. Soft tissues are unremarkable. IMPRESSION: Negative. Electronically Signed   By: Signa Kell M.D.   On: 01/04/2020 09:55   DG Finger Middle Left  Result Date: 01/04/2020 CLINICAL DATA:  Fall. EXAM: LEFT MIDDLE FINGER 2+V COMPARISON:  None FINDINGS: There is no evidence of fracture or dislocation. There is no evidence of arthropathy or other focal bone abnormality. Soft tissues are unremarkable. IMPRESSION: Negative. Electronically Signed   By: Signa Kell M.D.   On: 01/04/2020 09:56    Procedures Procedures (including critical care time)  Medications Ordered in ED Medications  HYDROcodone-acetaminophen (NORCO/VICODIN) 5-325 MG per tablet 1 tablet (1 tablet Oral Given 01/04/20 1006)    ED Course  I have reviewed the triage vital signs and the nursing notes.  Pertinent labs & imaging results that were available during my care of the patient were reviewed by me and considered in my medical decision making (see chart for details).    MDM Rules/Calculators/A&P                      Patient cannot take NSAIDs as he has history of PUD.  Will prescribe short course of Vicodin in addition to Tylenol 500 mg.   Instructed to follow-up with orthopedics for ongoing evaluation management.  Will place in sling for patient's comfort.  Recommending RICE therapy.  Strict return precautions discussed with the patient.  All of the evaluation and work-up results were discussed with the patient and any family at bedside. They were provided opportunity to ask any additional questions and have none at this time. They have expressed understanding of verbal discharge instructions as well as return precautions and are agreeable to the plan.    Final Clinical Impression(s) / ED Diagnoses Final diagnoses:  Fall, initial encounter    Rx / DC Orders ED Discharge Orders         Ordered    HYDROcodone-acetaminophen (NORCO/VICODIN) 5-325 MG tablet  Every 6 hours PRN     01/04/20 1031    acetaminophen (TYLENOL) 500 MG tablet  Every 6 hours PRN     01/04/20 1031           Lorelee New, PA-C 01/04/20 1031    Raeford Razor, MD 01/05/20 1429

## 2020-03-11 ENCOUNTER — Encounter (HOSPITAL_COMMUNITY): Payer: Self-pay | Admitting: Emergency Medicine

## 2020-03-11 ENCOUNTER — Other Ambulatory Visit: Payer: Self-pay

## 2020-03-11 ENCOUNTER — Emergency Department (HOSPITAL_COMMUNITY)
Admission: EM | Admit: 2020-03-11 | Discharge: 2020-03-11 | Disposition: A | Discharge status: Home / Self Care | Payer: 59 | Attending: Emergency Medicine | Admitting: Emergency Medicine

## 2020-03-11 ENCOUNTER — Emergency Department (HOSPITAL_COMMUNITY): Payer: 59

## 2020-03-11 DIAGNOSIS — R109 Unspecified abdominal pain: Secondary | ICD-10-CM

## 2020-03-11 DIAGNOSIS — I10 Essential (primary) hypertension: Secondary | ICD-10-CM | POA: Diagnosis not present

## 2020-03-11 DIAGNOSIS — R1031 Right lower quadrant pain: Secondary | ICD-10-CM | POA: Diagnosis not present

## 2020-03-11 DIAGNOSIS — Z7982 Long term (current) use of aspirin: Secondary | ICD-10-CM | POA: Insufficient documentation

## 2020-03-11 DIAGNOSIS — Z79899 Other long term (current) drug therapy: Secondary | ICD-10-CM | POA: Insufficient documentation

## 2020-03-11 LAB — URINALYSIS, ROUTINE W REFLEX MICROSCOPIC
Bilirubin Urine: NEGATIVE
Glucose, UA: NEGATIVE mg/dL
Hgb urine dipstick: NEGATIVE
Ketones, ur: NEGATIVE mg/dL
Leukocytes,Ua: NEGATIVE
Nitrite: NEGATIVE
Protein, ur: NEGATIVE mg/dL
Specific Gravity, Urine: 1.011 (ref 1.005–1.030)
pH: 7 (ref 5.0–8.0)

## 2020-03-11 LAB — CBC WITH DIFFERENTIAL/PLATELET
Abs Immature Granulocytes: 0.01 10*3/uL (ref 0.00–0.07)
Basophils Absolute: 0 10*3/uL (ref 0.0–0.1)
Basophils Relative: 0 %
Eosinophils Absolute: 0.1 10*3/uL (ref 0.0–0.5)
Eosinophils Relative: 1 %
HCT: 44.3 % (ref 39.0–52.0)
Hemoglobin: 15.1 g/dL (ref 13.0–17.0)
Immature Granulocytes: 0 %
Lymphocytes Relative: 31 %
Lymphs Abs: 1.4 10*3/uL (ref 0.7–4.0)
MCH: 31.8 pg (ref 26.0–34.0)
MCHC: 34.1 g/dL (ref 30.0–36.0)
MCV: 93.3 fL (ref 80.0–100.0)
Monocytes Absolute: 0.5 10*3/uL (ref 0.1–1.0)
Monocytes Relative: 11 %
Neutro Abs: 2.5 10*3/uL (ref 1.7–7.7)
Neutrophils Relative %: 57 %
Platelets: 269 10*3/uL (ref 150–400)
RBC: 4.75 MIL/uL (ref 4.22–5.81)
RDW: 13.8 % (ref 11.5–15.5)
WBC: 4.5 10*3/uL (ref 4.0–10.5)
nRBC: 0 % (ref 0.0–0.2)

## 2020-03-11 LAB — COMPREHENSIVE METABOLIC PANEL
ALT: 22 U/L (ref 0–44)
AST: 19 U/L (ref 15–41)
Albumin: 4.3 g/dL (ref 3.5–5.0)
Alkaline Phosphatase: 58 U/L (ref 38–126)
Anion gap: 7 (ref 5–15)
BUN: 14 mg/dL (ref 6–20)
CO2: 25 mmol/L (ref 22–32)
Calcium: 9.1 mg/dL (ref 8.9–10.3)
Chloride: 106 mmol/L (ref 98–111)
Creatinine, Ser: 1.12 mg/dL (ref 0.61–1.24)
GFR calc Af Amer: >60 mL/min (ref >60)
GFR calc non Af Amer: >60 mL/min (ref >60)
Glucose, Bld: 106 mg/dL — ABNORMAL HIGH (ref 70–99)
Potassium: 3.9 mmol/L (ref 3.5–5.1)
Sodium: 138 mmol/L (ref 135–145)
Total Bilirubin: 0.5 mg/dL (ref 0.3–1.2)
Total Protein: 7.5 g/dL (ref 6.5–8.1)

## 2020-03-11 LAB — LIPASE, BLOOD: Lipase: 29 U/L (ref 11–51)

## 2020-03-11 MED ORDER — SODIUM CHLORIDE 0.9% FLUSH: 3.0000 mL | Freq: Once | INTRAVENOUS | Status: DC

## 2020-03-11 MED ORDER — HYDROMORPHONE HCL 1 MG/ML IJ SOLN
1.0000 mg | Freq: Once | INTRAMUSCULAR | Status: AC
  Administered 2020-03-11: 1 mg via INTRAVENOUS
  Filled 2020-03-11: qty 1

## 2020-03-11 MED ORDER — SODIUM CHLORIDE 0.9 % IV BOLUS
1000.0000 mL | Freq: Once | INTRAVENOUS | Status: AC
  Administered 2020-03-11: 1000 mL via INTRAVENOUS

## 2020-03-11 MED ORDER — PANTOPRAZOLE SODIUM 40 MG PO TBEC: 40.0000 mg | DELAYED_RELEASE_TABLET | Freq: Every day | ORAL | 0 refills | Status: AC

## 2020-03-11 MED ORDER — FAMOTIDINE 20 MG PO TABS: 20.0000 mg | ORAL_TABLET | Freq: Two times a day (BID) | ORAL | 0 refills | Status: AC

## 2020-03-11 NOTE — Discharge Instructions (Signed)
If you develop worsening, continued, or recurrent abdominal pain, uncontrolled vomiting, fever, chest or back pain, or any other new/concerning symptoms then return to the ER for evaluation.  

## 2020-03-11 NOTE — ED Provider Notes (Signed)
[facility]-EMERGENCY DEPT Provider Note   CSN: [id number] Arrival date & time: 03/11/20  9518     History Chief Complaint  Patient presents with  . Abdominal Pain  . Emesis    Alejandro Turner is a 59 y.o. male.  HPI 59 year old male presents with right sided abdominal pain.  Has been ongoing since the beginning of April but much worse over the last 10 days.  It is colicky.  Sharp in nature.  Worst in his right lower abdomen but also some in the right upper.  No significant back pain.  For 5 days ago he had a fever up to 102 for 2 days and now has been having some chills on and off.  Some on and off vomiting.  Doctors he saw that are out of town have prescribed him Bentyl and Zofran.  Sometimes eating makes it worse.  He has noticed some dark urine but no hematuria.   Past Medical History:  Diagnosis Date  . Allergy   . Borderline diabetes   . Chest pain    a. 2013 nl Stress Echo;  b. 04/2015 Cath: nl cors, nl LV fxn.  . Daily headache   . GERD (gastroesophageal reflux disease)   . High cholesterol   . History of stomach ulcers   . Hypertension   . Kidney stones     Patient Active Problem List   Diagnosis Date Noted  . Chest pain at rest   . Chest pain with moderate risk for cardiac etiology 05/07/2015  . Hyponatremia 01/02/2012  . Fatigue 12/20/2011  . Borderline diabetes 12/19/2011  . Chest pain 12/17/2011  . HTN (hypertension) 12/17/2011  . Mixed hyperlipidemia 12/17/2011  . Left shoulder pain 12/17/2011    Past Surgical History:  Procedure Laterality Date  . BACK SURGERY    . CARDIAC CATHETERIZATION  03/2010  . CARDIAC CATHETERIZATION N/A 05/10/2015   Procedure: Left Heart Cath and Coronary Angiography;  Surgeon: Runell Gess, MD;  Location: [facility] INVASIVE CV LAB;  Service: Cardiovascular;  Laterality: N/A;  . LUMBAR DISC SURGERY  2005  . NOSE SURGERY  2005  . SHOULDER ARTHROSCOPY W/ ROTATOR CUFF REPAIR Left 1992  .  UVULOPALATOPHARYNGOPLASTY  ~ 2009       Family History  Problem Relation Age of Onset  . Kidney disease Mother   . Hypertension Mother   . Heart disease Mother   . Hyperlipidemia Mother   . Cancer Mother        Ovarian Cancer, Breast Cancer  . Diabetes Father   . Hypertension Father   . Heart disease Father   . Hyperlipidemia Father   . Heart disease Sister   . Hyperlipidemia Sister   . Kidney disease Brother     Social History   Tobacco Use  . Smoking status: Never Smoker  . Smokeless tobacco: Never Used  Substance Use Topics  . Alcohol use: No  . Drug use: No    Home Medications Prior to Admission medications   Medication Sig Start Date End Date Taking? Authorizing Provider  acetaminophen (TYLENOL) 500 MG tablet Take 1 tablet (500 mg total) by mouth every 6 (six) hours as needed. 01/04/20   Lorelee New, PA-C  aspirin EC 81 MG tablet Take 81 mg by mouth at bedtime.     [provider]  atorvastatin (LIPITOR) 10 MG tablet Take 10 mg by mouth at bedtime.     [provider]  ezetimibe (ZETIA) 10 MG  tablet Take 10 mg by mouth at bedtime.     [provider]  famotidine (PEPCID) 20 MG tablet Take 1 tablet (20 mg total) by mouth 2 (two) times daily. 03/11/20   Pricilla Loveless, MD  HYDROcodone-acetaminophen (NORCO/VICODIN) 5-325 MG tablet Take 1 tablet by mouth every 6 (six) hours as needed for severe pain. 01/04/20   Lorelee New, PA-C  montelukast (SINGULAIR) 5 MG chewable tablet Chew 5 mg by mouth at bedtime.    [provider]  naproxen (NAPROSYN) 500 MG tablet Take 1 tablet (500 mg total) by mouth 2 (two) times daily. Patient not taking: Reported on 01/25/2018 11/13/16   Joy, Ines Bloomer C, PA-C  olmesartan (BENICAR) 20 MG tablet Take 20 mg by mouth daily.     [provider]  ondansetron (ZOFRAN) 8 MG tablet Take 1 tablet (8 mg total) by mouth every 8 (eight) hours as needed for nausea or vomiting. 01/25/18   Donnetta Hutching, MD    pantoprazole (PROTONIX) 40 MG tablet Take 1 tablet (40 mg total) by mouth daily. 03/11/20   Pricilla Loveless, MD  promethazine (PHENERGAN) 25 MG tablet Take 1 tablet (25 mg total) by mouth every 6 (six) hours as needed for nausea or vomiting. 11/25/19   Lawyer, Cristal Deer, PA-C    Allergies    Contrast media [iodinated diagnostic agents], Food, Shrimp [shellfish allergy], and Tree extract  Review of Systems   Review of Systems  Constitutional: Positive for fever.  Gastrointestinal: Positive for abdominal pain, constipation, diarrhea and vomiting.  Genitourinary: Negative for dysuria and hematuria.  Musculoskeletal: Negative for back pain.  All other systems reviewed and are negative.   Physical Exam Updated Vital Signs BP 128/70   Pulse (!) 54   Temp 97.9 F (36.6 C) (Oral)   Resp 18   SpO2 100%   Physical Exam Vitals and nursing note reviewed.  Constitutional:      General: He is not in acute distress.    Appearance: He is well-developed. He is not ill-appearing or diaphoretic.  HENT:     Head: Normocephalic and atraumatic.     Right Ear: External ear normal.     Left Ear: External ear normal.     Nose: Nose normal.  Eyes:     General:        Right eye: No discharge.        Left eye: No discharge.  Cardiovascular:     Rate and Rhythm: Normal rate and regular rhythm.     Heart sounds: Normal heart sounds.  Pulmonary:     Effort: Pulmonary effort is normal.     Breath sounds: Normal breath sounds.  Abdominal:     Palpations: Abdomen is soft.     Tenderness: There is abdominal tenderness (RLQ tenderness is worst of tenderness) in the right upper quadrant and right lower quadrant. There is no right CVA tenderness or left CVA tenderness.  Genitourinary:    Testes:        Right: Tenderness or swelling not present.        Left: Tenderness or swelling not present.  Musculoskeletal:     Cervical back: Neck supple.  Skin:    General: Skin is warm and dry.  Neurological:      Mental Status: He is alert.  Psychiatric:        Mood and Affect: Mood is not anxious.     ED Results / Procedures / Treatments   Labs (all labs ordered are listed, but  only abnormal results are displayed) Labs Reviewed  COMPREHENSIVE METABOLIC PANEL - Abnormal; Notable for the following components:      Result Value   Glucose, Bld 106 (*)    All other components within normal limits  LIPASE, BLOOD  URINALYSIS, ROUTINE W REFLEX MICROSCOPIC  CBC WITH DIFFERENTIAL/PLATELET    EKG None  Radiology CT ABDOMEN PELVIS WO CONTRAST  Result Date: 03/11/2020 CLINICAL DATA:  Right lower quadrant abdominal pain. EXAM: CT ABDOMEN AND PELVIS WITHOUT CONTRAST TECHNIQUE: Multidetector CT imaging of the abdomen and pelvis was performed following the standard protocol without IV contrast. COMPARISON:  December 23, 2006. FINDINGS: Lower chest: No acute abnormality. Hepatobiliary: No focal liver abnormality is seen. No gallstones, gallbladder wall thickening, or biliary dilatation. Pancreas: Unremarkable. No pancreatic ductal dilatation or surrounding inflammatory changes. Spleen: Normal in size without focal abnormality. Adrenals/Urinary Tract: Adrenal glands appear normal. Bilateral nonobstructive nephrolithiasis is noted. No hydronephrosis or renal obstruction is noted. Urinary bladder is unremarkable. Stomach/Bowel: Stomach is within normal limits. Appendix appears normal. No evidence of bowel wall thickening, distention, or inflammatory changes. Vascular/Lymphatic: Aortic atherosclerosis. No enlarged abdominal or pelvic lymph nodes. Reproductive: Prostate is unremarkable. Other: No abdominal wall hernia or abnormality. No abdominopelvic ascites. Musculoskeletal: No acute or significant osseous findings. IMPRESSION: 1. Bilateral nonobstructive nephrolithiasis. No hydronephrosis or renal obstruction is noted. Aortic Atherosclerosis (ICD10-I70.0). Electronically Signed   By: Marijo Conception M.D.   On:  03/11/2020 14:23   US Abdomen Limited RUQ  Result Date: 03/11/2020 CLINICAL DATA:  Right upper quadrant pain. EXAM: ULTRASOUND ABDOMEN LIMITED RIGHT UPPER QUADRANT COMPARISON:  CT abdomen and pelvis today. FINDINGS: Gallbladder: No gallstones or wall thickening visualized. No sonographic Murphy sign noted by sonographer. Common bile duct: Diameter: 0.3 cm Liver: No focal lesion identified. Within normal limits in parenchymal echogenicity. Portal vein is patent on color Doppler imaging with normal direction of blood flow towards the liver. Other: None. IMPRESSION: Normal exam.  Negative for gallstones. Electronically Signed   By: Inge Rise M.D.   On: 03/11/2020 15:23    Procedures Procedures (including critical care time)  Medications Ordered in ED Medications  sodium chloride flush (NS) 0.9 % injection 3 mL (0 mLs Intravenous Hold 03/11/20 1504)  sodium chloride 0.9 % bolus 1,000 mL (1,000 mLs Intravenous New Bag/Given 03/11/20 1318)  HYDROmorphone (DILAUDID) injection 1 mg (1 mg Intravenous Given 03/11/20 1318)    ED Course  I have reviewed the triage vital signs and the nursing notes.  Pertinent labs & imaging results that were available during my care of the patient were reviewed by me and considered in my medical decision making (see chart for details).    MDM Rules/Calculators/A&P                      Work-up is unremarkable at this time.  CT and ultrasound images personally reviewed.  I reviewed his labs as well which are unremarkable.  Further history with patient indicates that he has had H. pylori a couple times and overall has had endoscopy before in [place].  This pain is somewhat similar.  We will treat with PPI and H2 blocker.  He is going back to [place] next week and encouraged to follow-up closely with his gastroenterologist.  Discussed return precautions. Final Clinical Impression(s) / ED Diagnoses Final diagnoses:  Right sided abdominal pain    Rx / DC  Orders ED Discharge Orders         Ordered  pantoprazole (PROTONIX) 40 MG tablet  Daily     03/11/20 1537    famotidine (PEPCID) 20 MG tablet  2 times daily     03/11/20 1537           Pricilla Loveless, MD 03/11/20 1537

## 2020-03-11 NOTE — ED Triage Notes (Signed)
Pt c/o right side abd pains with n/v for 10 days. reports told his GI doctor who changed his medications but didn't help, so was told to go to ED.

## 2020-03-22 ENCOUNTER — Other Ambulatory Visit: Payer: 59

## 2020-11-26 ENCOUNTER — Other Ambulatory Visit: Payer: 59

## 2021-12-04 IMAGING — DX DG FOREARM 2V*R*
2 series · 2 of 2 positions shown · non-contrast
Comparison: None.

CLINICAL DATA: Fall

EXAM:
RIGHT FOREARM - 2 VIEW

[forearm ap]
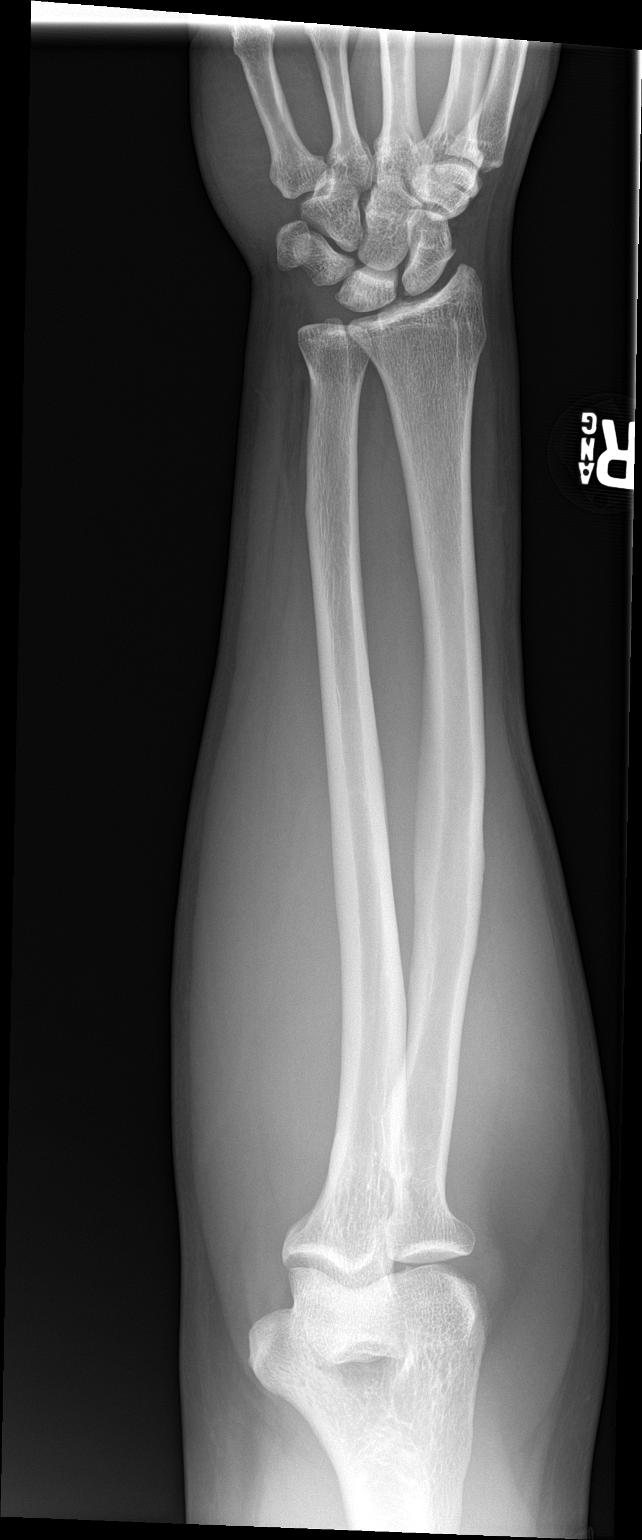

[forearm lat]
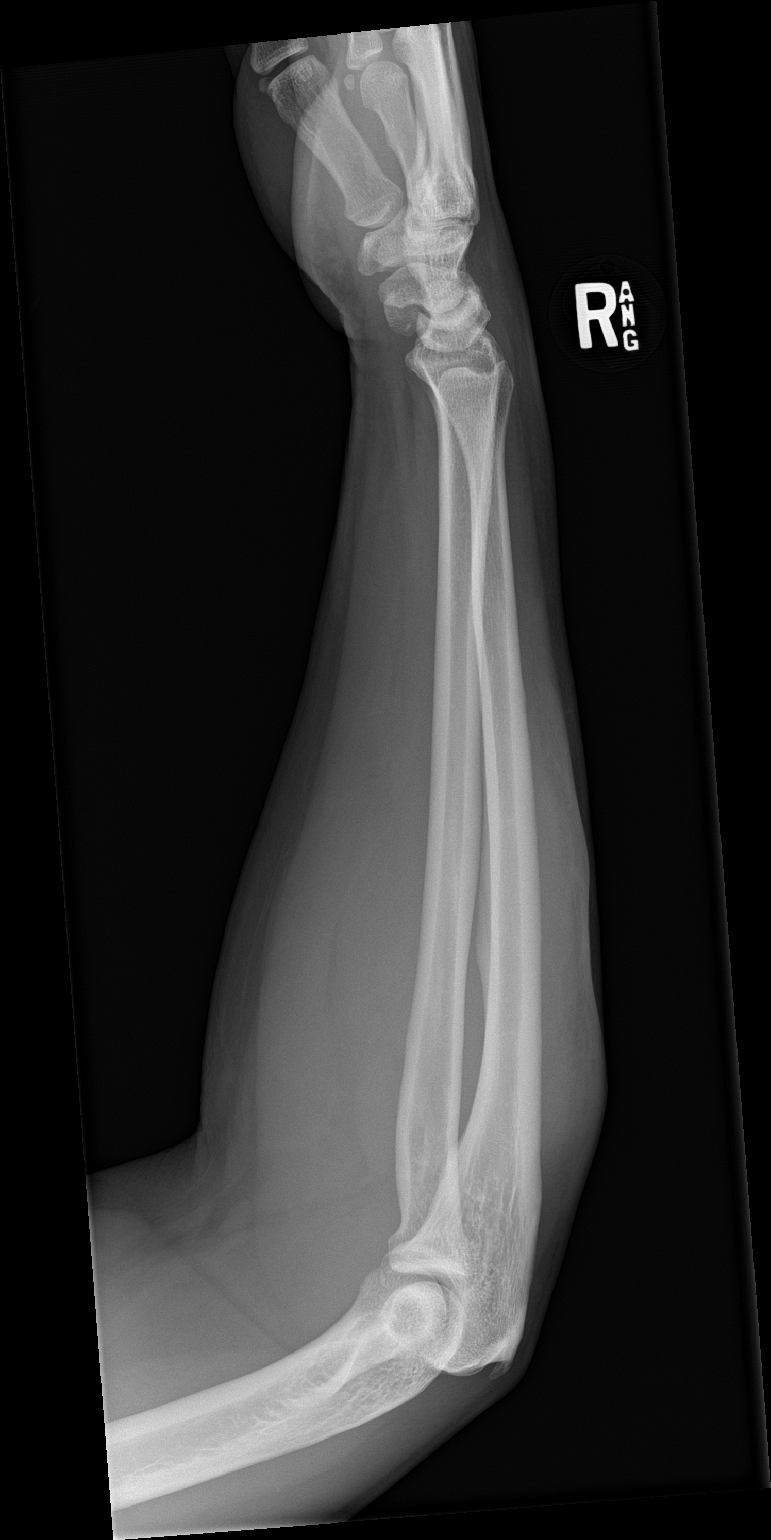

[2 of 2 positions shown; findings below may reference images not displayed]

FINDINGS: There is no evidence of fracture or other focal bone lesions. Soft
tissues are unremarkable.
IMPRESSION: Negative.
